# Patient Record
Sex: Male | Born: 1960
Health system: Southern US, Community
[De-identification: ages and names within clinical notes are randomized; demographics above are authoritative.]

## PROBLEM LIST (undated history)

## (undated) DIAGNOSIS — K648 Other hemorrhoids: Secondary | ICD-10-CM

## (undated) DIAGNOSIS — K589 Irritable bowel syndrome without diarrhea: Secondary | ICD-10-CM

## (undated) DIAGNOSIS — Z87442 Personal history of urinary calculi: Secondary | ICD-10-CM

## (undated) DIAGNOSIS — K264 Chronic or unspecified duodenal ulcer with hemorrhage: Secondary | ICD-10-CM

## (undated) DIAGNOSIS — T7840XA Allergy, unspecified, initial encounter: Secondary | ICD-10-CM

## (undated) DIAGNOSIS — I10 Essential (primary) hypertension: Secondary | ICD-10-CM

## (undated) DIAGNOSIS — N200 Calculus of kidney: Secondary | ICD-10-CM

## (undated) DIAGNOSIS — M1990 Unspecified osteoarthritis, unspecified site: Secondary | ICD-10-CM

## (undated) DIAGNOSIS — F338 Other recurrent depressive disorders: Secondary | ICD-10-CM

## (undated) DIAGNOSIS — E559 Vitamin D deficiency, unspecified: Secondary | ICD-10-CM

## (undated) HISTORY — DX: Other recurrent depressive disorders: F33.8

## (undated) HISTORY — DX: Calculus of kidney: N20.0

## (undated) HISTORY — DX: Allergy, unspecified, initial encounter: T78.40XA

## (undated) HISTORY — DX: Gilbert syndrome: E80.4

## (undated) HISTORY — DX: Chronic or unspecified duodenal ulcer with hemorrhage: K26.4

## (undated) HISTORY — DX: Unspecified osteoarthritis, unspecified site: M19.90

## (undated) HISTORY — DX: Vitamin D deficiency, unspecified: E55.9

## (undated) HISTORY — DX: Irritable bowel syndrome, unspecified: K58.9

## (undated) HISTORY — DX: Other hemorrhoids: K64.8

---
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
## 1990-08-07 HISTORY — PX: ESOPHAGOGASTRODUODENOSCOPY: SHX1529

## 1996-08-07 HISTORY — PX: COLONOSCOPY: SHX174

## 2002-10-13 ENCOUNTER — Ambulatory Visit (HOSPITAL_COMMUNITY): Admission: RE | Admit: 2002-10-13 | Discharge: 2002-10-13 | Payer: Self-pay | Admitting: Internal Medicine

## 2002-10-13 ENCOUNTER — Encounter: Payer: Self-pay | Admitting: Internal Medicine

## 2002-12-28 ENCOUNTER — Ambulatory Visit (HOSPITAL_COMMUNITY): Admission: RE | Admit: 2002-12-28 | Discharge: 2002-12-28 | Payer: Self-pay | Admitting: Orthopedic Surgery

## 2002-12-28 ENCOUNTER — Encounter: Payer: Self-pay | Admitting: Orthopedic Surgery

## 2004-04-27 ENCOUNTER — Ambulatory Visit (HOSPITAL_COMMUNITY): Admission: RE | Admit: 2004-04-27 | Discharge: 2004-04-27 | Payer: Self-pay | Admitting: Internal Medicine

## 2005-03-01 ENCOUNTER — Ambulatory Visit: Payer: Self-pay | Admitting: Gastroenterology

## 2006-03-08 ENCOUNTER — Ambulatory Visit: Payer: Self-pay | Admitting: Internal Medicine

## 2006-03-14 ENCOUNTER — Ambulatory Visit: Payer: Self-pay | Admitting: Internal Medicine

## 2007-03-29 ENCOUNTER — Ambulatory Visit: Payer: Self-pay | Admitting: Internal Medicine

## 2007-04-10 ENCOUNTER — Encounter: Admission: RE | Admit: 2007-04-10 | Discharge: 2007-04-10 | Payer: Self-pay | Admitting: Internal Medicine

## 2007-05-06 ENCOUNTER — Encounter: Payer: Self-pay | Admitting: Internal Medicine

## 2007-05-06 DIAGNOSIS — J309 Allergic rhinitis, unspecified: Secondary | ICD-10-CM | POA: Insufficient documentation

## 2007-05-06 DIAGNOSIS — D509 Iron deficiency anemia, unspecified: Secondary | ICD-10-CM | POA: Insufficient documentation

## 2007-05-29 ENCOUNTER — Ambulatory Visit: Payer: Self-pay | Admitting: Internal Medicine

## 2007-05-29 LAB — CONVERTED CEMR LAB
ALT: 21 units/L (ref 0–53)
AST: 18 units/L (ref 0–37)
Albumin: 4.6 g/dL (ref 3.5–5.2)
Alkaline Phosphatase: 67 units/L (ref 39–117)
BUN: 20 mg/dL (ref 6–23)
Basophils Absolute: 0 10*3/uL (ref 0.0–0.1)
Basophils Relative: 0.2 % (ref 0.0–1.0)
Bilirubin Urine: NEGATIVE
Bilirubin, Direct: 0.3 mg/dL (ref 0.0–0.3)
CO2: 31 meq/L (ref 19–32)
Calcium: 10 mg/dL (ref 8.4–10.5)
Chloride: 107 meq/L (ref 96–112)
Cholesterol: 197 mg/dL (ref 0–200)
Creatinine, Ser: 1 mg/dL (ref 0.4–1.5)
Eosinophils Absolute: 0.1 10*3/uL (ref 0.0–0.6)
Eosinophils Relative: 2 % (ref 0.0–5.0)
GFR calc Af Amer: 104 mL/min
GFR calc non Af Amer: 86 mL/min
Glucose, Bld: 111 mg/dL — ABNORMAL HIGH (ref 70–99)
HCT: 46.7 % (ref 39.0–52.0)
HDL: 58.4 mg/dL (ref >39.0)
Hemoglobin: 16.1 g/dL (ref 13.0–17.0)
Ketones, ur: NEGATIVE mg/dL
LDL Cholesterol: 124 mg/dL — ABNORMAL HIGH (ref 0–99)
Leukocytes, UA: NEGATIVE
Lymphocytes Relative: 29.2 % (ref 12.0–46.0)
MCHC: 34.4 g/dL (ref 30.0–36.0)
MCV: 89.8 fL (ref 78.0–100.0)
Monocytes Absolute: 0.4 10*3/uL (ref 0.2–0.7)
Monocytes Relative: 6.2 % (ref 3.0–11.0)
Neutro Abs: 4 10*3/uL (ref 1.4–7.7)
Neutrophils Relative %: 62.4 % (ref 43.0–77.0)
Nitrite: NEGATIVE
PSA: 0.44 ng/mL (ref 0.10–4.00)
Platelets: 224 10*3/uL (ref 150–400)
Potassium: 4.5 meq/L (ref 3.5–5.1)
RBC: 5.2 M/uL (ref 4.22–5.81)
RDW: 12.2 % (ref 11.5–14.6)
Sodium: 141 meq/L (ref 135–145)
Specific Gravity, Urine: 1.015 (ref 1.000–1.03)
TSH: 1.37 microintl units/mL (ref 0.35–5.50)
Total Bilirubin: 1.9 mg/dL — ABNORMAL HIGH (ref 0.3–1.2)
Total CHOL/HDL Ratio: 3.4
Total Protein, Urine: NEGATIVE mg/dL
Total Protein: 7.5 g/dL (ref 6.0–8.3)
Triglycerides: 73 mg/dL (ref 0–149)
Urine Glucose: NEGATIVE mg/dL
Urobilinogen, UA: 0.2 (ref 0.0–1.0)
VLDL: 15 mg/dL (ref 0–40)
Vit D, 1,25-Dihydroxy: 14 — ABNORMAL LOW (ref 30–89)
WBC: 6.3 10*3/uL (ref 4.5–10.5)
pH: 7 (ref 5.0–8.0)

## 2007-06-05 ENCOUNTER — Telehealth: Payer: Self-pay | Admitting: Internal Medicine

## 2007-06-21 ENCOUNTER — Encounter: Payer: Self-pay | Admitting: Internal Medicine

## 2007-06-21 ENCOUNTER — Ambulatory Visit: Payer: Self-pay | Admitting: Cardiology

## 2007-06-21 DIAGNOSIS — N200 Calculus of kidney: Secondary | ICD-10-CM | POA: Insufficient documentation

## 2008-11-04 ENCOUNTER — Encounter: Payer: Self-pay | Admitting: Internal Medicine

## 2010-09-06 NOTE — Progress Notes (Signed)
Summary: Rx request  Phone Note Call from Patient   Summary of Call: Passing a kidney stone. Refill meds Initial call taken by: Tresa Garter MD,  June 05, 2007 5:31 PM  Follow-up for Phone Call        Maralyn Sago, Pls, send it to Dr G.'s nurse in [facility] Follow-up by: Tresa Garter MD,  June 05, 2007 5:34 PM  Additional Follow-up for Phone Call Additional follow up Details #1::        Hand delivered to Dr Timoteo Ex[facility] nurse on 3rd floor Additional Follow-up by: Lamar Sprinkles,  June 06, 2007 8:57 AM    New/Updated Medications: KETOROLAC TROMETHAMINE 10 MG  TABS (KETOROLAC TROMETHAMINE) 1 three times a day as needed pain VICODIN 5-500 MG TABS (HYDROCODONE-ACETAMINOPHEN) 1 qid prn   Prescriptions: VICODIN 5-500 MG TABS (HYDROCODONE-ACETAMINOPHEN) 1 qid prn  #60 x 0   Entered and Authorized by:   Tresa Garter MD   Signed by:   Tresa Garter MD on 06/05/2007   Method used:   Print then Give to Patient   RxID:   [id number] KETOROLAC TROMETHAMINE 10 MG  TABS (KETOROLAC TROMETHAMINE) 1 three times a day as needed pain  #30 x 1   Entered and Authorized by:   Tresa Garter MD   Signed by:   Tresa Garter MD on 06/05/2007   Method used:   Print then Give to Patient   RxID:   [id number]

## 2010-09-06 NOTE — Miscellaneous (Signed)
Summary: CT  Clinical Lists Changes  Orders: Added new Referral order of Radiology Referral (Radiology) - Signed

## 2010-09-06 NOTE — Miscellaneous (Signed)
Summary: Orders Update  Clinical Lists Changes  Problems: Added new problem of KIDNEY STONE (ICD-592.0) Orders: Added new Test order of CT without Contrast (CT w/o contrast) - Signed

## 2010-09-06 NOTE — Miscellaneous (Signed)
Summary: rx  Clinical Lists Changes  Medications: Changed medication from FLONASE 50 MCG/ACT  SUSP (FLUTICASONE PROPIONATE) once daily to FLONASE 50 MCG/ACT  SUSP (FLUTICASONE PROPIONATE) 1 spr each nostr once daily - Signed Rx of FLONASE 50 MCG/ACT  SUSP (FLUTICASONE PROPIONATE) 1 spr each nostr once daily;  #3 x 3;  Signed;  Entered by: Tresa Garter MD;  Authorized by: Tresa Garter MD;  Method used: Print then Give to Patient Rx of FLONASE 50 MCG/ACT  SUSP (FLUTICASONE PROPIONATE) 1 spr each nostr once daily;  #3 x 3;  Signed;  Entered by: Lamar Sprinkles;  Authorized by: Tresa Garter MD;  Method used: Faxed to [facility]*, , ,   , Ph: [phone], Fax: [phone]    Prescriptions: FLONASE 50 MCG/ACT  SUSP (FLUTICASONE PROPIONATE) 1 spr each nostr once daily  #3 x 3   Entered by:   Lamar Sprinkles   Authorized by:   Tresa Garter MD   Signed by:   Lamar Sprinkles on 11/04/2008   Method used:   Faxed to ...       [facility]* (mail-order)             ,          Ph: [phone]       Fax: [phone]   RxID:   [id number] FLONASE 50 MCG/ACT  SUSP (FLUTICASONE PROPIONATE) 1 spr each nostr once daily  #3 x 3   Entered and Authorized by:   Tresa Garter MD   Signed by:   Tresa Garter MD on 11/04/2008   Method used:   Print then Give to Patient   RxID:   [id number]

## 2010-12-20 NOTE — Assessment & Plan Note (Signed)
[facility]                           PRIMARY CARE OFFICE NOTE   NAME:[name], [name]                       MRN:          [id number]  DATE:03/29/2007                            DOB:          1960/12/29    The patient is a 50 year old male who presents with a complaint of:  1. Pain in the neck, mostly on the left side.  The pain is mostly      located in the left trapezius shoulder, occasionally around the      elbow area near the lateral epicondyle.  No clumsiness.  Very      little paresthesia.  The pain is worse during colonoscopies or      other light activities.  Has been going on for several months if      not years.  Getting progressively worse.  2. He is also complaining of allergies.  3. Complains of itchy eyes.  4. Seasonal mood disorder.   PHYSICAL:  Blood pressure 132/79, pulse 74, temperature 98.2, weight 183  pounds.  He is in no acute distress.  Right-handed.  He is alert, oriented, and  cooperative.  No depression present.  HEENT:  Normal mucosa.  NECK:  Supple.  Slightly sensitive over the left trapezius.  Shoulders with full range  of motion.  Muscles without deficit.  Peripheral strength is within  normal limits.  Spine movements normal.  LUNGS:  Clear.  HEART:  Regular.  ABDOMEN:  Soft and nontender.  LOWER EXTREMITIES:  Without edema.   ASSESSMENT AND PLAN:  1. Left-sided cervical/left upper extremity pain.  Will obtain MRI of      the C spine to rule out radiculopathy producing abnormalities.      Could be related to his occupation and a heavy procedure load.      There is some evidence of possible lateral epicondylitis over the      left elbow.  Possibly some tendinitis as well.  I gave him voltaren      cream to use.  He has been using NSAID with PPI p.r.n.  He will see      if the endoscopy room ergonomics could be changed some.  2. Seasonal allergies.  Loratadine 10 mg daily.  Optivar drops 1 drop      each eye daily.   Twice daily Flonase, 1 or 2 sprays each nostril      daily.  3. Seasonal mood disorder.  He would like to try Effexor.  Will start      on 37.5 mg for a month then 75 mg.     Donald Quint. Plotnikov, MD  Electronically Signed    AVP/MedQ  DD: 04/01/2007  DT: 04/02/2007  Job #: [id number]

## 2010-12-23 NOTE — Assessment & Plan Note (Signed)
[facility]                             PRIMARY CARE OFFICE NOTE   NAME:[name], [name]                       MRN:          [id number]  DATE:03/14/2006                            DOB:          [birth year]    The patient is a 50 year old male who presents for a well examination.  Past  medical history, family history and social history are as per March 10, 2003.   ALLERGIES:  None.   CURRENT MEDICATIONS:  He takes Prevacid at times, allergy over-the-counter  medications at times.  Was on Zoloft 100 mg daily November through March for  seasonal depression per Dr. Donell Beers.   REVIEW OF SYSTEMS:  Currently doing well emotionally.  No chest pain or  shortness of breath.  Occasional problems with hemorrhoids.  No blood in the  stool.  The rest is unremarkable.   PHYSICAL EXAMINATION:  GENERAL:  He looks well.  VITAL SIGNS:  Blood pressure 116/69, pulse 71, temperature 99.8, weight 175  pounds.  HEENT:  Moist mucosa.  NECK:  Supple, no thyromegaly or bruit.  LUNGS:  Clear, no wheeze or rales.  CARDIAC:  S1, S2, no gallop, no murmur.  ABDOMEN:  Soft, nontender, no organomegaly or mass felt.  EXTREMITIES:  Lower extremities without edema.  NEUROLOGIC:  He is alert, oriented and co-operative.  He is not depressed.  Normal genitalia, testicles free of masses.  RECTAL:  Normal prostate.  No masses.  No nodules.  Stool guaiac-negative.  SKIN:  Fair skin with multiple freckles.  Several small moles on the back  and lower extremities.   LABORATORY DATA:  EKG was normal sinus rhythm.  Labs on March 09, 2006:  CBC  normal.  Cholesterol 182, LDL 123, glucose 104.  TSH normal.  Urinalysis  normal.  EKG today is normal.   ASSESSMENT AND PLAN:  1. Age/health related issues discussed.  Healthy lifestyle discussed.  He      will continue with good diet and exercise program.  He can start to      take a baby aspirin a day, calcium and vitamin D, fish oil.  2.  Slightly elevated LDL, which is near normal.  Again, he will continue      with fish oil and exercise.  3. slightly elevated glucose, of no significance.  We will recheck in 12      months.  4. Moles.  He does regular skin self-checkup and also has it checked by      his dermatologist      friend.  Continue with the surveillance.  Biopsy any mole changes in      color.  5. Season depression, managed by Dr. Donell Beers, doing well now.                                   [name], MD   AP/MedQ  DD:  03/15/2006  DT:  03/15/2006  Job #:  [id number]

## 2013-06-27 ENCOUNTER — Other Ambulatory Visit: Payer: Self-pay

## 2013-06-27 DIAGNOSIS — Z1211 Encounter for screening for malignant neoplasm of colon: Secondary | ICD-10-CM

## 2013-06-27 MED ORDER — MOVIPREP 100 G PO SOLR: 1.0000 | Freq: Once | ORAL | Status: DC

## 2013-07-01 ENCOUNTER — Encounter: Payer: Self-pay | Admitting: Gastroenterology

## 2013-07-02 ENCOUNTER — Ambulatory Visit (AMBULATORY_SURGERY_CENTER): Payer: 59 | Admitting: Gastroenterology

## 2013-07-02 ENCOUNTER — Encounter: Payer: Self-pay | Admitting: Gastroenterology

## 2013-07-02 VITALS — BP 157/94 | HR 63 | Temp 96.8°F | Resp 18 | Ht 72.0 in | Wt 176.0 lb

## 2013-07-02 DIAGNOSIS — Z1211 Encounter for screening for malignant neoplasm of colon: Secondary | ICD-10-CM

## 2013-07-02 DIAGNOSIS — D126 Benign neoplasm of colon, unspecified: Secondary | ICD-10-CM

## 2013-07-02 MED ORDER — SODIUM CHLORIDE 0.9 % IV SOLN: 500.0000 mL | INTRAVENOUS | Status: DC

## 2013-07-02 NOTE — Patient Instructions (Signed)
Polyp Instruction Given.  YOU HAD AN ENDOSCOPIC PROCEDURE TODAY AT THE [facility]: Refer to the procedure report that was given to you for any specific questions about what was found during the examination.  If the procedure report does not answer your questions, please call your gastroenterologist to clarify.  If you requested that your care partner not be given the details of your procedure findings, then the procedure report has been included in a sealed envelope for you to review at your convenience later.  YOU SHOULD EXPECT: Some feelings of bloating in the abdomen. Passage of more gas than usual.  Walking can help get rid of the air that was put into your GI tract during the procedure and reduce the bloating. If you had a lower endoscopy (such as a colonoscopy or flexible sigmoidoscopy) you may notice spotting of blood in your stool or on the toilet paper. If you underwent a bowel prep for your procedure, then you may not have a normal bowel movement for a few days.  DIET: Your first meal following the procedure should be a light meal and then it is ok to progress to your normal diet.  A half-sandwich or bowl of soup is an example of a good first meal.  Heavy or fried foods are harder to digest and may make you feel nauseous or bloated.  Likewise meals heavy in dairy and vegetables can cause extra gas to form and this can also increase the bloating.  Drink plenty of fluids but you should avoid alcoholic beverages for 24 hours.  ACTIVITY: Your care partner should take you home directly after the procedure.  You should plan to take it easy, moving slowly for the rest of the day.  You can resume normal activity the day after the procedure however you should NOT DRIVE or use heavy machinery for 24 hours (because of the sedation medicines used during the test).    SYMPTOMS TO REPORT IMMEDIATELY: A gastroenterologist can be reached at any hour.  During normal business hours, 8:30 AM to 5:00  PM Monday through Friday, call [phone].  After hours and on weekends, please call the GI answering service at [phone] who will take a message and have the physician on call contact you.   Following lower endoscopy (colonoscopy or flexible sigmoidoscopy):  Excessive amounts of blood in the stool  Significant tenderness or worsening of abdominal pains  Swelling of the abdomen that is new, acute  Fever of 100F or higher   FOLLOW UP: If any biopsies were taken you will be contacted by phone or by letter within the next 1-3 weeks.  Call your gastroenterologist if you have not heard about the biopsies in 3 weeks.  Our staff will call the home number listed on your records the next business day following your procedure to check on you and address any questions or concerns that you may have at that time regarding the information given to you following your procedure. This is a courtesy call and so if there is no answer at the home number and we have not heard from you through the emergency physician on call, we will assume that you have returned to your regular daily activities without incident.  SIGNATURES/CONFIDENTIALITY: You and/or your care partner have signed paperwork which will be entered into your electronic medical record.  These signatures attest to the fact that that the information above on your After Visit Summary has been reviewed and is understood.  Full responsibility  of the confidentiality of this discharge information lies with you and/or your care-partner.

## 2013-07-02 NOTE — Progress Notes (Signed)
Lidocaine-40mg IV prior to Propofol InductionPropofol given over incremental dosages 

## 2013-07-02 NOTE — Progress Notes (Signed)
Patient did not have preoperative order for IV antibiotic SSI prophylaxis. (G8918)  Patient did not experience any of the following events: a burn prior to discharge; a fall within the facility; wrong site/side/patient/procedure/implant event; or a hospital transfer or hospital admission upon discharge from the facility. (G8907)  

## 2013-07-02 NOTE — Progress Notes (Signed)
No egg or soy allergy. ewm No problems with past sedation. ewm 

## 2013-07-02 NOTE — Op Note (Signed)
[facility] [address] [place], [address]   COLONOSCOPY PROCEDURE REPORT  PATIENT: [name], [name]  MR#: [id number] BIRTHDATE: 1960/09/15 , 52  yrs. old GENDER: Male ENDOSCOPIST: Rachael Fee, MD PROCEDURE DATE:  07/02/2013 PROCEDURE:   Colonoscopy with biopsy First Screening Colonoscopy - Avg.  risk and is 50 yrs.  old or older Yes.  Prior Negative Screening - Now for repeat screening. N/A  History of Adenoma - Now for follow-up colonoscopy & has been > or = to 3 yrs.  N/A  Polyps Removed Today? Yes. ASA CLASS:   Class I INDICATIONS:average risk screening. MEDICATIONS: Propofol (Diprivan) 230 mg IV and MAC sedation, administered by CRNA  DESCRIPTION OF PROCEDURE:   After the risks benefits and alternatives of the procedure were thoroughly explained, informed consent was obtained.  A digital rectal exam revealed no abnormalities of the rectum.   The [facility] [id number]  endoscope was introduced through the anus and advanced to the cecum, which was identified by both the appendix and ileocecal valve. No adverse events experienced.   The quality of the prep was excellent.  The instrument was then slowly withdrawn as the colon was fully examined.   COLON FINDINGS: One polyp was found, removed and sent to pathology. This was sessile 1-35mm across, located in sigmoid segment, removed with biopsy forceps.  The examination was otherwise normal. Retroflexed views revealed no abnormalities. The time to cecum=3 minutes 22 seconds.  Withdrawal time=11 minutes 30 seconds.  The scope was withdrawn and the procedure completed. COMPLICATIONS: There were no complications.  ENDOSCOPIC IMPRESSION: One polyp was found, removed and sent to pathology. The examination was otherwise normal.  RECOMMENDATIONS: If the polyp removed today is proven to be an adenomatous (pre-cancerous) polyp, you will need a repeat colonoscopy in 5 years.  Otherwise you should continue to  follow colorectal cancer screening guidelines for "routine risk" patients with colonoscopy in 10 years.  You will receive a letter within 1-2 weeks with the results of your biopsy as well as final recommendations.  Please call my office if you have not received a letter after 3 weeks.   eSigned:  Rachael Fee, MD 07/02/2013 8:46 AM   cc: Eric Form, MD   PATIENT NAME:  [name], [name] MR#: [id number]

## 2013-07-02 NOTE — Progress Notes (Signed)
Called to room to assist during endoscopic procedure.  Patient ID and intended procedure confirmed with present staff. Received instructions for my participation in the procedure from the performing physician. ewm 

## 2013-07-07 ENCOUNTER — Telehealth: Payer: Self-pay | Admitting: *Deleted

## 2013-07-07 NOTE — Telephone Encounter (Signed)
  Follow up Call-  Call back number 07/02/2013  Post procedure Call Back phone  # [phone]  Permission to leave phone message Yes     Patient questions:  Do you have a fever, pain , or abdominal swelling? no Pain Score  0 *  Have you tolerated food without any problems? yes  Have you been able to return to your normal activities? yes  Do you have any questions about your discharge instructions: Diet   no Medications  no Follow up visit  no  Do you have questions or concerns about your Care? no  Actions: * If pain score is 4 or above: No action needed, pain <4.

## 2013-07-08 ENCOUNTER — Encounter: Payer: Self-pay | Admitting: Gastroenterology

## 2015-09-16 DIAGNOSIS — D2272 Melanocytic nevi of left lower limb, including hip: Secondary | ICD-10-CM | POA: Diagnosis not present

## 2015-09-16 DIAGNOSIS — D171 Benign lipomatous neoplasm of skin and subcutaneous tissue of trunk: Secondary | ICD-10-CM | POA: Diagnosis not present

## 2015-09-16 DIAGNOSIS — D2271 Melanocytic nevi of right lower limb, including hip: Secondary | ICD-10-CM | POA: Diagnosis not present

## 2015-09-16 DIAGNOSIS — D485 Neoplasm of uncertain behavior of skin: Secondary | ICD-10-CM | POA: Diagnosis not present

## 2015-09-16 DIAGNOSIS — D2261 Melanocytic nevi of right upper limb, including shoulder: Secondary | ICD-10-CM | POA: Diagnosis not present

## 2015-09-16 DIAGNOSIS — D1801 Hemangioma of skin and subcutaneous tissue: Secondary | ICD-10-CM | POA: Diagnosis not present

## 2015-09-16 DIAGNOSIS — L821 Other seborrheic keratosis: Secondary | ICD-10-CM | POA: Diagnosis not present

## 2015-09-16 DIAGNOSIS — D225 Melanocytic nevi of trunk: Secondary | ICD-10-CM | POA: Diagnosis not present

## 2015-09-16 DIAGNOSIS — D2262 Melanocytic nevi of left upper limb, including shoulder: Secondary | ICD-10-CM | POA: Diagnosis not present

## 2015-09-22 DIAGNOSIS — H5213 Myopia, bilateral: Secondary | ICD-10-CM | POA: Diagnosis not present

## 2015-09-23 MED FILL — ATORVASTATIN 20 MG TABLET: 20 | 90 days supply | Qty: 90 | Fill #0

## 2015-09-27 DIAGNOSIS — D2261 Melanocytic nevi of right upper limb, including shoulder: Secondary | ICD-10-CM | POA: Diagnosis not present

## 2015-09-27 DIAGNOSIS — D485 Neoplasm of uncertain behavior of skin: Secondary | ICD-10-CM | POA: Diagnosis not present

## 2015-09-29 MED FILL — MUPIROCIN 2% OINTMENT: 2 | 7 days supply | Qty: 22 | Fill #0

## 2015-11-02 MED FILL — DULoxetine HCL 60 MG CPEP: 60 | 90 days supply | Qty: 90 | Fill #1

## 2016-01-10 MED FILL — ATORVASTATIN 20 MG TABLET: 20 | 90 days supply | Qty: 90 | Fill #1

## 2016-01-19 MED FILL — CEPHALEXIN 500 MG CAPSULE: 500 | 7 days supply | Qty: 30 | Fill #0

## 2016-01-19 MED FILL — predniSONE 10 MG TABS: 10 | 12 days supply | Qty: 42 | Fill #0

## 2016-01-19 MED FILL — METHOCARBAMOL 500 MG TABLET: 500 | 22 days supply | Qty: 90 | Fill #1

## 2016-01-19 MED FILL — DULoxetine HCL 60 MG CPEP: 60 | 90 days supply | Qty: 90 | Fill #0

## 2016-01-21 ENCOUNTER — Telehealth: Payer: Self-pay | Admitting: Gastroenterology

## 2016-01-21 MED ORDER — ACETAZOLAMIDE 125 MG PO TABS: 125.0000 mg | ORAL_TABLET | Freq: Two times a day (BID) | ORAL | Status: DC | PRN

## 2016-01-21 MED ORDER — DEXAMETHASONE 4 MG PO TABS: 4.0000 mg | ORAL_TABLET | Freq: Four times a day (QID) | ORAL | Status: DC | PRN

## 2016-01-21 MED FILL — DEXAMETHASONE 4 MG TABLET: 4 | 7 days supply | Qty: 30 | Fill #0

## 2016-01-21 MED FILL — acetaZOLAMIDE 125 MG TABS: 125 | 10 days supply | Qty: 20 | Fill #0

## 2016-01-21 NOTE — Telephone Encounter (Signed)
[name] is going on an extended hiking trip, some at high altitude.  I'm calling in meds to help with altitude sickness, PRN.   Dexamethasone and aceazolamide

## 2016-05-15 MED FILL — DULoxetine HCL 60 MG CPEP: 60 | 90 days supply | Qty: 90 | Fill #1

## 2016-06-23 MED FILL — ATORVASTATIN 20 MG TABLET: 20 | 90 days supply | Qty: 90 | Fill #2

## 2016-06-23 MED FILL — VIVOTIF EC CAPSULE: 8 days supply | Qty: 4 | Fill #0

## 2016-07-11 DIAGNOSIS — R7301 Impaired fasting glucose: Secondary | ICD-10-CM | POA: Diagnosis not present

## 2016-07-11 DIAGNOSIS — E559 Vitamin D deficiency, unspecified: Secondary | ICD-10-CM | POA: Diagnosis not present

## 2016-07-11 DIAGNOSIS — Z125 Encounter for screening for malignant neoplasm of prostate: Secondary | ICD-10-CM | POA: Diagnosis not present

## 2016-07-11 DIAGNOSIS — E784 Other hyperlipidemia: Secondary | ICD-10-CM | POA: Diagnosis not present

## 2016-07-21 DIAGNOSIS — Z6827 Body mass index (BMI) 27.0-27.9, adult: Secondary | ICD-10-CM | POA: Diagnosis not present

## 2016-07-21 DIAGNOSIS — Z125 Encounter for screening for malignant neoplasm of prostate: Secondary | ICD-10-CM | POA: Diagnosis not present

## 2016-07-21 DIAGNOSIS — Z0000 Encounter for general adult medical examination without abnormal findings: Secondary | ICD-10-CM | POA: Diagnosis not present

## 2016-07-21 DIAGNOSIS — R7301 Impaired fasting glucose: Secondary | ICD-10-CM | POA: Diagnosis not present

## 2016-07-21 DIAGNOSIS — E784 Other hyperlipidemia: Secondary | ICD-10-CM | POA: Diagnosis not present

## 2016-07-21 DIAGNOSIS — F338 Other recurrent depressive disorders: Secondary | ICD-10-CM | POA: Diagnosis not present

## 2016-07-21 DIAGNOSIS — R030 Elevated blood-pressure reading, without diagnosis of hypertension: Secondary | ICD-10-CM | POA: Diagnosis not present

## 2016-07-21 DIAGNOSIS — K648 Other hemorrhoids: Secondary | ICD-10-CM | POA: Diagnosis not present

## 2016-07-21 DIAGNOSIS — Z86010 Personal history of colonic polyps: Secondary | ICD-10-CM | POA: Diagnosis not present

## 2016-07-21 DIAGNOSIS — E559 Vitamin D deficiency, unspecified: Secondary | ICD-10-CM | POA: Diagnosis not present

## 2016-07-21 DIAGNOSIS — Z1389 Encounter for screening for other disorder: Secondary | ICD-10-CM | POA: Diagnosis not present

## 2016-08-16 MED FILL — DULoxetine HCL 60 MG CPEP: 60 | 90 days supply | Qty: 90 | Fill #0

## 2016-09-19 DIAGNOSIS — D1801 Hemangioma of skin and subcutaneous tissue: Secondary | ICD-10-CM | POA: Diagnosis not present

## 2016-09-19 DIAGNOSIS — D2272 Melanocytic nevi of left lower limb, including hip: Secondary | ICD-10-CM | POA: Diagnosis not present

## 2016-09-19 DIAGNOSIS — D2271 Melanocytic nevi of right lower limb, including hip: Secondary | ICD-10-CM | POA: Diagnosis not present

## 2016-09-19 DIAGNOSIS — D225 Melanocytic nevi of trunk: Secondary | ICD-10-CM | POA: Diagnosis not present

## 2016-09-19 DIAGNOSIS — D2262 Melanocytic nevi of left upper limb, including shoulder: Secondary | ICD-10-CM | POA: Diagnosis not present

## 2016-09-19 DIAGNOSIS — D2261 Melanocytic nevi of right upper limb, including shoulder: Secondary | ICD-10-CM | POA: Diagnosis not present

## 2016-09-19 DIAGNOSIS — D171 Benign lipomatous neoplasm of skin and subcutaneous tissue of trunk: Secondary | ICD-10-CM | POA: Diagnosis not present

## 2016-11-09 MED FILL — ATORVASTATIN 20 MG TABLET: 20 | 90 days supply | Qty: 90 | Fill #0

## 2016-11-14 MED FILL — DULoxetine HCL 60 MG CPEP: 60 | 90 days supply | Qty: 90 | Fill #1

## 2017-03-06 MED FILL — DULoxetine HCL 60 MG CPEP: 60 | 90 days supply | Qty: 90 | Fill #2

## 2017-03-06 MED FILL — ATORVASTATIN 20 MG TABLET: 20 | 90 days supply | Qty: 90 | Fill #1

## 2017-06-05 MED FILL — DULoxetine HCL 60 MG CPEP: 60 | 90 days supply | Qty: 90 | Fill #0

## 2017-06-05 MED FILL — ATORVASTATIN 20 MG TABLET: 20 | 90 days supply | Qty: 90 | Fill #2

## 2017-08-08 DIAGNOSIS — R82998 Other abnormal findings in urine: Secondary | ICD-10-CM | POA: Diagnosis not present

## 2017-08-09 DIAGNOSIS — E7849 Other hyperlipidemia: Secondary | ICD-10-CM | POA: Diagnosis not present

## 2017-08-09 DIAGNOSIS — Z125 Encounter for screening for malignant neoplasm of prostate: Secondary | ICD-10-CM | POA: Diagnosis not present

## 2017-08-09 DIAGNOSIS — E559 Vitamin D deficiency, unspecified: Secondary | ICD-10-CM | POA: Diagnosis not present

## 2017-08-09 DIAGNOSIS — R7301 Impaired fasting glucose: Secondary | ICD-10-CM | POA: Diagnosis not present

## 2017-08-14 DIAGNOSIS — J309 Allergic rhinitis, unspecified: Secondary | ICD-10-CM | POA: Diagnosis not present

## 2017-08-14 DIAGNOSIS — E7849 Other hyperlipidemia: Secondary | ICD-10-CM | POA: Diagnosis not present

## 2017-08-14 DIAGNOSIS — Z86010 Personal history of colonic polyps: Secondary | ICD-10-CM | POA: Diagnosis not present

## 2017-08-14 DIAGNOSIS — Z0000 Encounter for general adult medical examination without abnormal findings: Secondary | ICD-10-CM | POA: Diagnosis not present

## 2017-08-14 DIAGNOSIS — F338 Other recurrent depressive disorders: Secondary | ICD-10-CM | POA: Diagnosis not present

## 2017-08-14 DIAGNOSIS — E559 Vitamin D deficiency, unspecified: Secondary | ICD-10-CM | POA: Diagnosis not present

## 2017-08-14 DIAGNOSIS — R030 Elevated blood-pressure reading, without diagnosis of hypertension: Secondary | ICD-10-CM | POA: Diagnosis not present

## 2017-08-14 DIAGNOSIS — M7712 Lateral epicondylitis, left elbow: Secondary | ICD-10-CM | POA: Diagnosis not present

## 2017-08-14 DIAGNOSIS — R7301 Impaired fasting glucose: Secondary | ICD-10-CM | POA: Diagnosis not present

## 2017-08-28 DIAGNOSIS — H16042 Marginal corneal ulcer, left eye: Secondary | ICD-10-CM | POA: Diagnosis not present

## 2017-08-30 DIAGNOSIS — H16041 Marginal corneal ulcer, right eye: Secondary | ICD-10-CM | POA: Diagnosis not present

## 2017-09-03 DIAGNOSIS — H16041 Marginal corneal ulcer, right eye: Secondary | ICD-10-CM | POA: Diagnosis not present

## 2017-09-24 DIAGNOSIS — D225 Melanocytic nevi of trunk: Secondary | ICD-10-CM | POA: Diagnosis not present

## 2017-09-24 DIAGNOSIS — L814 Other melanin hyperpigmentation: Secondary | ICD-10-CM | POA: Diagnosis not present

## 2017-09-24 DIAGNOSIS — D1722 Benign lipomatous neoplasm of skin and subcutaneous tissue of left arm: Secondary | ICD-10-CM | POA: Diagnosis not present

## 2017-09-24 DIAGNOSIS — D171 Benign lipomatous neoplasm of skin and subcutaneous tissue of trunk: Secondary | ICD-10-CM | POA: Diagnosis not present

## 2017-09-24 DIAGNOSIS — D1801 Hemangioma of skin and subcutaneous tissue: Secondary | ICD-10-CM | POA: Diagnosis not present

## 2017-09-24 DIAGNOSIS — D2272 Melanocytic nevi of left lower limb, including hip: Secondary | ICD-10-CM | POA: Diagnosis not present

## 2017-09-24 DIAGNOSIS — D1721 Benign lipomatous neoplasm of skin and subcutaneous tissue of right arm: Secondary | ICD-10-CM | POA: Diagnosis not present

## 2017-09-24 DIAGNOSIS — D485 Neoplasm of uncertain behavior of skin: Secondary | ICD-10-CM | POA: Diagnosis not present

## 2017-09-24 DIAGNOSIS — L820 Inflamed seborrheic keratosis: Secondary | ICD-10-CM | POA: Diagnosis not present

## 2017-09-27 MED FILL — DULoxetine HCL 60 MG CPEP: 60 | 90 days supply | Qty: 90 | Fill #1

## 2017-10-24 DIAGNOSIS — H04221 Epiphora due to insufficient drainage, right lacrimal gland: Secondary | ICD-10-CM | POA: Diagnosis not present

## 2017-10-24 DIAGNOSIS — H10501 Unspecified blepharoconjunctivitis, right eye: Secondary | ICD-10-CM | POA: Diagnosis not present

## 2017-11-21 MED FILL — ATORVASTATIN 20 MG TABLET: 20 | 90 days supply | Qty: 90 | Fill #0

## 2017-12-10 DIAGNOSIS — H524 Presbyopia: Secondary | ICD-10-CM | POA: Diagnosis not present

## 2017-12-10 MED FILL — NEO/POLYMYXIN/DEXAMETH DROP: 3.5-10000-0 | 30 days supply | Qty: 5 | Fill #0

## 2018-01-03 MED FILL — DULoxetine HCL 60 MG CPEP: 60 | 90 days supply | Qty: 90 | Fill #2

## 2018-01-29 DIAGNOSIS — L570 Actinic keratosis: Secondary | ICD-10-CM | POA: Diagnosis not present

## 2018-01-29 DIAGNOSIS — D485 Neoplasm of uncertain behavior of skin: Secondary | ICD-10-CM | POA: Diagnosis not present

## 2018-04-11 MED FILL — DULoxetine HCL 60 MG CPEP: 60 | 90 days supply | Qty: 90 | Fill #0

## 2018-04-11 MED FILL — METHOCARBAMOL 500 MG TABLET: 500 | 22 days supply | Qty: 90 | Fill #0

## 2018-04-11 MED FILL — ATORVASTATIN CALCIUM 20 MG: 20 | 90 days supply | Qty: 90 | Fill #1

## 2018-07-01 DIAGNOSIS — H01002 Unspecified blepharitis right lower eyelid: Secondary | ICD-10-CM | POA: Diagnosis not present

## 2018-07-23 MED FILL — DULOXETINE HCL 60 MG CPEP: 60 | 90 days supply | Qty: 90 | Fill #1

## 2018-07-28 ENCOUNTER — Encounter: Payer: Self-pay | Admitting: Gastroenterology

## 2018-10-09 MED FILL — ATORVASTATIN 20 MG TABLET: 20 | 90 days supply | Qty: 90 | Fill #2

## 2018-10-22 DIAGNOSIS — Z125 Encounter for screening for malignant neoplasm of prostate: Secondary | ICD-10-CM | POA: Diagnosis not present

## 2018-10-22 DIAGNOSIS — R7301 Impaired fasting glucose: Secondary | ICD-10-CM | POA: Diagnosis not present

## 2018-10-22 DIAGNOSIS — E7849 Other hyperlipidemia: Secondary | ICD-10-CM | POA: Diagnosis not present

## 2018-10-22 DIAGNOSIS — E559 Vitamin D deficiency, unspecified: Secondary | ICD-10-CM | POA: Diagnosis not present

## 2018-10-22 DIAGNOSIS — Z0000 Encounter for general adult medical examination without abnormal findings: Secondary | ICD-10-CM | POA: Diagnosis not present

## 2018-10-28 DIAGNOSIS — Z0000 Encounter for general adult medical examination without abnormal findings: Secondary | ICD-10-CM | POA: Diagnosis not present

## 2018-10-28 DIAGNOSIS — Z1331 Encounter for screening for depression: Secondary | ICD-10-CM | POA: Diagnosis not present

## 2018-10-28 DIAGNOSIS — E785 Hyperlipidemia, unspecified: Secondary | ICD-10-CM | POA: Diagnosis not present

## 2018-10-28 DIAGNOSIS — F339 Major depressive disorder, recurrent, unspecified: Secondary | ICD-10-CM | POA: Diagnosis not present

## 2018-10-28 DIAGNOSIS — Z86010 Personal history of colonic polyps: Secondary | ICD-10-CM | POA: Diagnosis not present

## 2018-10-28 DIAGNOSIS — Z1339 Encounter for screening examination for other mental health and behavioral disorders: Secondary | ICD-10-CM | POA: Diagnosis not present

## 2018-10-28 DIAGNOSIS — M542 Cervicalgia: Secondary | ICD-10-CM | POA: Diagnosis not present

## 2018-10-28 DIAGNOSIS — R030 Elevated blood-pressure reading, without diagnosis of hypertension: Secondary | ICD-10-CM | POA: Diagnosis not present

## 2018-10-28 DIAGNOSIS — R7301 Impaired fasting glucose: Secondary | ICD-10-CM | POA: Diagnosis not present

## 2018-10-28 DIAGNOSIS — M25519 Pain in unspecified shoulder: Secondary | ICD-10-CM | POA: Diagnosis not present

## 2018-10-30 MED FILL — DULOXETINE HCL 60 MG CPEP: 60 | 90 days supply | Qty: 90 | Fill #0

## 2018-12-16 DIAGNOSIS — H5213 Myopia, bilateral: Secondary | ICD-10-CM | POA: Diagnosis not present

## 2019-02-11 MED FILL — DULOXETINE HCL 60 MG CPEP: 60 | 90 days supply | Qty: 90 | Fill #1

## 2019-04-11 MED FILL — ATORVASTATIN 20 MG TABLET: 20 | 90 days supply | Qty: 90 | Fill #0

## 2019-05-26 MED FILL — METHOCARBAMOL 500 MG TABS: 500 | 22 days supply | Qty: 90 | Fill #0

## 2019-05-26 MED FILL — DULOXETINE HCL 60 MG CPEP: 60 | 90 days supply | Qty: 90 | Fill #0

## 2019-06-11 DIAGNOSIS — L821 Other seborrheic keratosis: Secondary | ICD-10-CM | POA: Diagnosis not present

## 2019-06-11 DIAGNOSIS — D225 Melanocytic nevi of trunk: Secondary | ICD-10-CM | POA: Diagnosis not present

## 2019-06-11 DIAGNOSIS — D2261 Melanocytic nevi of right upper limb, including shoulder: Secondary | ICD-10-CM | POA: Diagnosis not present

## 2019-06-11 DIAGNOSIS — D171 Benign lipomatous neoplasm of skin and subcutaneous tissue of trunk: Secondary | ICD-10-CM | POA: Diagnosis not present

## 2019-06-11 DIAGNOSIS — D485 Neoplasm of uncertain behavior of skin: Secondary | ICD-10-CM | POA: Diagnosis not present

## 2019-06-11 DIAGNOSIS — D1801 Hemangioma of skin and subcutaneous tissue: Secondary | ICD-10-CM | POA: Diagnosis not present

## 2019-06-11 DIAGNOSIS — D2272 Melanocytic nevi of left lower limb, including hip: Secondary | ICD-10-CM | POA: Diagnosis not present

## 2019-06-11 DIAGNOSIS — L814 Other melanin hyperpigmentation: Secondary | ICD-10-CM | POA: Diagnosis not present

## 2019-06-20 ENCOUNTER — Other Ambulatory Visit: Payer: Self-pay

## 2019-06-20 ENCOUNTER — Ambulatory Visit (INDEPENDENT_AMBULATORY_CARE_PROVIDER_SITE_OTHER)
Admission: RE | Admit: 2019-06-20 | Discharge: 2019-06-20 | Disposition: A | Discharge status: Home / Self Care | Payer: Self-pay | Source: Ambulatory Visit | Attending: Gastroenterology | Admitting: Gastroenterology

## 2019-06-20 ENCOUNTER — Telehealth: Payer: Self-pay | Admitting: Gastroenterology

## 2019-06-20 ENCOUNTER — Other Ambulatory Visit (INDEPENDENT_AMBULATORY_CARE_PROVIDER_SITE_OTHER): Payer: 59

## 2019-06-20 DIAGNOSIS — R319 Hematuria, unspecified: Secondary | ICD-10-CM

## 2019-06-20 DIAGNOSIS — R109 Unspecified abdominal pain: Secondary | ICD-10-CM

## 2019-06-20 LAB — HEPATIC FUNCTION PANEL
ALT: 16 U/L (ref 0–53)
AST: 16 U/L (ref 0–37)
Albumin: 5 g/dL (ref 3.5–5.2)
Alkaline Phosphatase: 68 U/L (ref 39–117)
Bilirubin, Direct: 0.3 mg/dL (ref 0.0–0.3)
Total Bilirubin: 1.6 mg/dL — ABNORMAL HIGH (ref 0.2–1.2)
Total Protein: 7.4 g/dL (ref 6.0–8.3)

## 2019-06-20 LAB — BASIC METABOLIC PANEL
BUN: 20 mg/dL (ref 6–23)
CO2: 28 mEq/L (ref 19–32)
Calcium: 9.3 mg/dL (ref 8.4–10.5)
Chloride: 103 mEq/L (ref 96–112)
Creatinine, Ser: 1.03 mg/dL (ref 0.40–1.50)
GFR: 74.59 mL/min (ref >60.00)
Glucose, Bld: 116 mg/dL — ABNORMAL HIGH (ref 70–99)
Potassium: 4.2 mEq/L (ref 3.5–5.1)
Sodium: 139 mEq/L (ref 135–145)

## 2019-06-20 LAB — URINALYSIS, ROUTINE W REFLEX MICROSCOPIC
Bilirubin Urine: NEGATIVE
Hgb urine dipstick: NEGATIVE
Ketones, ur: NEGATIVE
Leukocytes,Ua: NEGATIVE
Nitrite: NEGATIVE
Specific Gravity, Urine: 1.01 (ref 1.000–1.030)
Total Protein, Urine: NEGATIVE
Urine Glucose: NEGATIVE
Urobilinogen, UA: 0.2 (ref 0.0–1.0)
pH: 6.5 (ref 5.0–8.0)

## 2019-06-20 LAB — CBC
HCT: 45.1 % (ref 39.0–52.0)
Hemoglobin: 15.3 g/dL (ref 13.0–17.0)
MCHC: 34 g/dL (ref 30.0–36.0)
MCV: 90.6 fl (ref 78.0–100.0)
Platelets: 201 10*3/uL (ref 150.0–400.0)
RBC: 4.98 Mil/uL (ref 4.22–5.81)
RDW: 13.6 % (ref 11.5–15.5)
WBC: 7 10*3/uL (ref 4.0–10.5)

## 2019-06-20 NOTE — Addendum Note (Signed)
Addended by: Milus Banister on: 06/20/2019 11:04 AM   Modules accepted: Orders

## 2019-06-20 NOTE — Progress Notes (Signed)
Hepatic function panel added to labs

## 2019-06-20 NOTE — Telephone Encounter (Signed)
Also KUB

## 2019-06-20 NOTE — Telephone Encounter (Signed)
[name] is having flank pain and gross hematuria.  Ordering UA, cbc, bmet now.  Expedite referral to [facility], seems likely kidney stone.

## 2019-06-23 ENCOUNTER — Telehealth: Payer: Self-pay | Admitting: Gastroenterology

## 2019-06-23 DIAGNOSIS — Z7184 Encounter for health counseling related to travel: Secondary | ICD-10-CM

## 2019-06-23 NOTE — Telephone Encounter (Signed)
[name] is traveling out of the country soon.  Covid antibody testing ordered to facilitate travel plans.

## 2019-06-30 ENCOUNTER — Other Ambulatory Visit: Payer: Self-pay | Admitting: Gastroenterology

## 2019-06-30 ENCOUNTER — Other Ambulatory Visit: Payer: Self-pay

## 2019-06-30 DIAGNOSIS — Z7184 Encounter for health counseling related to travel: Secondary | ICD-10-CM | POA: Diagnosis not present

## 2019-07-01 LAB — SAR COV2 SEROLOGY (COVID19)AB(IGG),IA: SARS CoV2 AB IGG: NEGATIVE

## 2019-07-08 DIAGNOSIS — Z1159 Encounter for screening for other viral diseases: Secondary | ICD-10-CM | POA: Diagnosis not present

## 2019-09-02 MED FILL — DULOXETINE HCL 60 MG CPEP: 60 | 90 days supply | Qty: 90 | Fill #1

## 2019-11-18 DIAGNOSIS — Z125 Encounter for screening for malignant neoplasm of prostate: Secondary | ICD-10-CM | POA: Diagnosis not present

## 2019-11-18 DIAGNOSIS — E559 Vitamin D deficiency, unspecified: Secondary | ICD-10-CM | POA: Diagnosis not present

## 2019-11-18 DIAGNOSIS — R7301 Impaired fasting glucose: Secondary | ICD-10-CM | POA: Diagnosis not present

## 2019-11-18 DIAGNOSIS — E7849 Other hyperlipidemia: Secondary | ICD-10-CM | POA: Diagnosis not present

## 2019-11-18 DIAGNOSIS — Z0000 Encounter for general adult medical examination without abnormal findings: Secondary | ICD-10-CM | POA: Diagnosis not present

## 2019-11-21 DIAGNOSIS — E559 Vitamin D deficiency, unspecified: Secondary | ICD-10-CM | POA: Diagnosis not present

## 2019-11-21 DIAGNOSIS — R7301 Impaired fasting glucose: Secondary | ICD-10-CM | POA: Diagnosis not present

## 2019-11-21 DIAGNOSIS — I10 Essential (primary) hypertension: Secondary | ICD-10-CM | POA: Diagnosis not present

## 2019-11-21 DIAGNOSIS — Z0000 Encounter for general adult medical examination without abnormal findings: Secondary | ICD-10-CM | POA: Diagnosis not present

## 2019-11-21 DIAGNOSIS — M25552 Pain in left hip: Secondary | ICD-10-CM | POA: Diagnosis not present

## 2019-11-21 DIAGNOSIS — Z86010 Personal history of colonic polyps: Secondary | ICD-10-CM | POA: Diagnosis not present

## 2019-11-21 DIAGNOSIS — R82998 Other abnormal findings in urine: Secondary | ICD-10-CM | POA: Diagnosis not present

## 2019-11-21 DIAGNOSIS — Z1339 Encounter for screening examination for other mental health and behavioral disorders: Secondary | ICD-10-CM | POA: Diagnosis not present

## 2019-11-21 DIAGNOSIS — J309 Allergic rhinitis, unspecified: Secondary | ICD-10-CM | POA: Diagnosis not present

## 2019-11-21 DIAGNOSIS — Z1331 Encounter for screening for depression: Secondary | ICD-10-CM | POA: Diagnosis not present

## 2019-11-21 DIAGNOSIS — E785 Hyperlipidemia, unspecified: Secondary | ICD-10-CM | POA: Diagnosis not present

## 2019-11-21 DIAGNOSIS — F339 Major depressive disorder, recurrent, unspecified: Secondary | ICD-10-CM | POA: Diagnosis not present

## 2019-11-21 MED FILL — LISINOPRIL 20 MG TABLET: 20 | 30 days supply | Qty: 30 | Fill #0

## 2019-11-28 ENCOUNTER — Ambulatory Visit (INDEPENDENT_AMBULATORY_CARE_PROVIDER_SITE_OTHER): Payer: 59

## 2019-11-28 ENCOUNTER — Ambulatory Visit: Payer: Self-pay

## 2019-11-28 ENCOUNTER — Other Ambulatory Visit: Payer: Self-pay

## 2019-11-28 ENCOUNTER — Ambulatory Visit: Payer: 59 | Admitting: Orthopedic Surgery

## 2019-11-28 ENCOUNTER — Encounter: Payer: Self-pay | Admitting: Orthopedic Surgery

## 2019-11-28 VITALS — Ht 71.0 in | Wt 183.0 lb

## 2019-11-28 DIAGNOSIS — M25552 Pain in left hip: Secondary | ICD-10-CM | POA: Diagnosis not present

## 2019-11-28 DIAGNOSIS — M545 Low back pain, unspecified: Secondary | ICD-10-CM

## 2019-11-28 NOTE — Progress Notes (Signed)
Subjective: Patient is here for ultrasound-guided intra-articular left hip injection.   Impingement pain with cam deformity.  Objective:  Still has good active ROM.  Procedure: Ultrasound-guided left hip injection: After sterile prep with Betadine, injected 8 cc 1% lidocaine without epinephrine and 40 mg methylprednisolone using a 22-gauge spinal needle, passing the needle through the iliofemoral ligament into the femoral head/neck junction.  Injectate seen filling joint capsule.

## 2019-11-28 NOTE — Progress Notes (Signed)
Office Visit Note   Patient: [name]           Date of Birth: 1961-05-24           MRN: [id number] Visit Date: 11/28/2019 Requested by: Marton Redwood, MD [address] [place],  [place] [address] PCP: Marton Redwood, MD  Subjective: Chief Complaint  Patient presents with   Lower Back - Pain   Left Hip - Pain    HPI: [name] is a 59 year old patient with left hip pain and ischial tuberosity pain.'s been going on for 6 months.  It is not improving on its own.  He is active doing hiking and yoga.  Denies any radicular symptoms.  No numbness and tingling.  Hard for him to sleep on the left-hand side.  He did recently change mattresses which helped.  Mountain biking is more prominent in his exercise routine and road biking.  Also does pickleball.  He does report some groin pain when he is doing a hard push uphill on his bike.  Symptoms are typically worse the following day after exertion.  Initial pain does not typically happen at the same time as the groin pain.  Sitting is worse than lying down typically.              ROS: All systems reviewed are negative as they relate to the chief complaint within the history of present illness.  Patient denies  fevers or chills.   Assessment & Plan: Visit Diagnoses:  1. Acute left-sided low back pain, unspecified whether sciatica present   2. Pain in left hip     Plan: Impression is left hip pain with cam impingement likely based on radiographs and exam.  I think he may also have a component of gluteal tendinopathy and ischial hamstring attachment tendinopathy.  I do not think it is coming from the back.  I would like him to get an injection with Dr. Junius Roads today into the hip joint.  We will see how that helps.  Follow-up with me as needed but neck step would be MRI arthrogram of the hip if he is considering any type of surgical intervention.  My sense is that [name] would likely live with this before doing any type of arthroscopic surgery which is  pretty reasonable.  I think an injection every now and then would likely be enough to get him through it.  Follow-Up Instructions: Return if symptoms worsen or fail to improve.   Orders:  Orders Placed This Encounter  Procedures   XR HIP UNILAT W OR W/O PELVIS 2-3 VIEWS LEFT   XR Lumbar Spine 2-3 Views   US Guided Needle Placement   No orders of the defined types were placed in this encounter.     Procedures: No procedures performed   Clinical Data: No additional findings.  Objective: Vital Signs: Ht 5\' 11"  (1.803 m)    Wt 183 lb (83 kg)    BMI 25.52 kg/m   Physical Exam:   Constitutional: Patient appears well-developed HEENT:  Head: Normocephalic Eyes:EOM are normal Neck: Normal range of motion Cardiovascular: Normal rate Pulmonary/chest: Effort normal Neurologic: Patient is alert Skin: Skin is warm Psychiatric: Patient has normal mood and affect    Ortho Exam: Ortho exam demonstrates normal gait alignment.  Very good muscle tone in the legs.  Nonantalgic gait.  No Trendelenburg gait.  No groin pain with internal extra rotation of either leg.  Has 5 out of 5 ankle dorsiflexion plantarflexion quad hamstring strength.  Reflexes symmetric 1+ out of 4 bilateral patella and Achilles.  Negative clonus.  Pedal pulses palpable.  No real trochanteric tenderness is noted.  Mild left gluteal tenderness but no discrete SI joint tenderness to palpation.  Ischial tuberosity nontender left or right hand side.  Specialty Comments:  No specialty comments available.  Imaging: XR HIP UNILAT W OR W/O PELVIS 2-3 VIEWS LEFT  Result Date: 11/28/2019 AP pelvis lateral left hip reviewed.  Cam lesion present consistent with femoral acetabular impingement on the superior femoral neck.  No acute fracture.  No significant degenerative arthritis is present.  Bone density appears good.  XR Lumbar Spine 2-3 Views  Result Date: 11/28/2019 AP lateral lumbar spine reviewed.  Minimal spinal  listhesis present.  No significant degenerative disc disease between the vertebral bodies.  Mild facet arthritis is present.    PMFS History: Patient Active Problem List   Diagnosis Date Noted   KIDNEY STONE 06/21/2007   ANEMIA-IRON DEFICIENCY 05/06/2007   ALLERGIC RHINITIS 05/06/2007   Past Medical History:  Diagnosis Date   Allergy    SEASONAL   Duodenal ulcer with hemorrhage    X2   Gilbert's syndrome    IBS (irritable bowel syndrome)    Internal hemorrhoids    Nephrolithiasis    Osteoarthritis    Seasonal affective disorder ([place])    Vitamin D deficiency     Family History  Problem Relation Age of Onset   Lung cancer Mother 58   Lymphoma Father 42   Cancer - Other Father 61       AMPULLARY   Thyroid cancer Sister 50   Colon cancer Neg Hx     Past Surgical History:  Procedure Laterality Date   COLONOSCOPY  1998   ESOPHAGOGASTRODUODENOSCOPY  1992   TONSILLECTOMY AND ADENOIDECTOMY  1972   Social History   Occupational History   Not on file  Tobacco Use   Smoking status: Never Smoker   Smokeless tobacco: Never Used  Substance and Sexual Activity   Alcohol use: Yes    Alcohol/week: 5.0 standard drinks    Types: 5 drink(s) per week    Comment: 5 drinks a week   Drug use: No   Sexual activity: Not on file

## 2019-12-05 MED FILL — LOSARTAN POTASSIUM 50 MG TA: 50 | 30 days supply | Qty: 30 | Fill #0

## 2019-12-08 ENCOUNTER — Other Ambulatory Visit: Payer: Self-pay

## 2019-12-08 DIAGNOSIS — M25552 Pain in left hip: Secondary | ICD-10-CM

## 2019-12-17 DIAGNOSIS — H5213 Myopia, bilateral: Secondary | ICD-10-CM | POA: Diagnosis not present

## 2019-12-23 ENCOUNTER — Other Ambulatory Visit (HOSPITAL_COMMUNITY): Payer: Self-pay | Admitting: Internal Medicine

## 2019-12-23 MED FILL — OLMESARTAN MEDOXOMIL 40 MG: 40 | 90 days supply | Qty: 90 | Fill #0

## 2019-12-23 MED FILL — MELOXICAM 15 MG TABLET: 15 | 90 days supply | Qty: 90 | Fill #0

## 2019-12-24 ENCOUNTER — Other Ambulatory Visit: Payer: Self-pay

## 2019-12-24 ENCOUNTER — Ambulatory Visit
Admission: RE | Admit: 2019-12-24 | Discharge: 2019-12-24 | Disposition: A | Discharge status: Home / Self Care | Payer: 59 | Source: Ambulatory Visit | Attending: Orthopedic Surgery | Admitting: Orthopedic Surgery

## 2019-12-24 DIAGNOSIS — M25552 Pain in left hip: Secondary | ICD-10-CM | POA: Diagnosis not present

## 2019-12-24 MED ORDER — IOPAMIDOL (ISOVUE-M 200) INJECTION 41%
15.0000 mL | Freq: Once | INTRAMUSCULAR | Status: AC
  Administered 2019-12-24: 15 mL via INTRA_ARTICULAR

## 2019-12-26 ENCOUNTER — Telehealth: Payer: Self-pay

## 2019-12-26 NOTE — Telephone Encounter (Signed)
I had Dr Marlou Sa to reach out to patient to have him contact me regarding this.

## 2019-12-26 NOTE — Telephone Encounter (Signed)
Per Dr Marlou Sa I called Dr Aretha Parrot office and spoke with his assistant Annie Main.  He took all of patients information and will reach out to him about getting appointment scheduled to see Dr Aretha Parrot.  Can you please make patient a CD with him images? I will have him reach out to [facility] about getting his MRI on disc

## 2020-01-14 DIAGNOSIS — M24152 Other articular cartilage disorders, left hip: Secondary | ICD-10-CM | POA: Diagnosis not present

## 2020-01-29 DIAGNOSIS — M25552 Pain in left hip: Secondary | ICD-10-CM | POA: Diagnosis not present

## 2020-01-29 DIAGNOSIS — R269 Unspecified abnormalities of gait and mobility: Secondary | ICD-10-CM | POA: Diagnosis not present

## 2020-02-12 DIAGNOSIS — M25552 Pain in left hip: Secondary | ICD-10-CM | POA: Diagnosis not present

## 2020-02-12 DIAGNOSIS — R269 Unspecified abnormalities of gait and mobility: Secondary | ICD-10-CM | POA: Diagnosis not present

## 2020-02-23 ENCOUNTER — Ambulatory Visit (INDEPENDENT_AMBULATORY_CARE_PROVIDER_SITE_OTHER): Payer: 59

## 2020-02-23 ENCOUNTER — Ambulatory Visit (INDEPENDENT_AMBULATORY_CARE_PROVIDER_SITE_OTHER): Payer: 59 | Admitting: Family Medicine

## 2020-02-23 ENCOUNTER — Encounter: Payer: Self-pay | Admitting: Family Medicine

## 2020-02-23 ENCOUNTER — Other Ambulatory Visit: Payer: Self-pay

## 2020-02-23 ENCOUNTER — Ambulatory Visit: Payer: Self-pay

## 2020-02-23 DIAGNOSIS — G8929 Other chronic pain: Secondary | ICD-10-CM

## 2020-02-23 DIAGNOSIS — M25512 Pain in left shoulder: Secondary | ICD-10-CM

## 2020-02-23 NOTE — Progress Notes (Signed)
Office Visit Note   Patient: [name]           Date of Birth: January 14, 1961           MRN: [id number] Visit Date: 02/23/2020 Requested by: Donald Redwood, MD [address] [place],  [place] [address] PCP: Donald Redwood, MD  Subjective: Chief Complaint  Patient presents with  . Left Shoulder - Pain    HPI: He is here with left shoulder pain.  He is right-hand dominant.  Symptoms started a few months ago, no injury.  He hurts when he raises his arm for prolonged periods, such as when performing a colonoscopy.  It also hurts to lie on his left side.  He is using meloxicam for his hip and it helps his shoulder a little bit.  Denies any neck pain or radicular symptoms.               ROS:   All other systems were reviewed and are negative.  Objective: Vital Signs: There were no vitals taken for this visit.  Physical Exam:  General:  Alert and oriented, in no acute distress. Pulm:  Breathing unlabored. Psy:  Normal mood, congruent affect.  Left shoulder: Full active range of motion compared to the right, no adhesive capsulitis.  Rotator cuff strength is 5/5 throughout with minimal pain on empty can testing.  He is point tender at the [facility] joint and has a positive AC crossover test.  No tenderness in the subacromial space.  Imaging: US Guided Needle Placement  Result Date: 02/23/2020 Ultrasound-guided left AC joint injection:  After sterile prep with betadine, injected 3 cc 1% lidocaine without epinephrine and 40 mg methylprednisolone into the [facility] joint without complication.  Injectate was seen filling the joint capsule.  He had very good immediate relief.  XR Shoulder Left  Result Date: 02/23/2020 X-rays left shoulder reveal anatomic alignment, type I acromion.  Moderate AC joint DJD with joint space narrowing and periarticular spurring.  Glenohumeral joint looks healthy.  Soft tissues look normal.   Assessment & Plan: 1.  Left shoulder pain due to [facility] joint DJD -Discussed options  with him, he would like to try an injection.  He will also try glucosamine.  Voltaren gel will be a future consideration.  Distal clavicle excision if he fails conservative management.  Ultrasound-guided left AC joint injection: After sterile prep with Betadine, injected     Procedures: No procedures performed  No notes on file     PMFS History: Patient Active Problem List   Diagnosis Date Noted  . KIDNEY STONE 06/21/2007  . ANEMIA-IRON DEFICIENCY 05/06/2007  . ALLERGIC RHINITIS 05/06/2007   Past Medical History:  Diagnosis Date  . Allergy    SEASONAL  . Duodenal ulcer with hemorrhage    X2  . Gilbert's syndrome   . IBS (irritable bowel syndrome)   . Internal hemorrhoids   . Nephrolithiasis   . Osteoarthritis   . Seasonal affective disorder ([place])   . Vitamin D deficiency     Family History  Problem Relation Age of Onset  . Lung cancer Mother 32  . Lymphoma Father 25  . Cancer - Other Father 47       AMPULLARY  . Thyroid cancer Sister 34  . Colon cancer Neg Hx     Past Surgical History:  Procedure Laterality Date  . COLONOSCOPY  1998  . ESOPHAGOGASTRODUODENOSCOPY  1992  . [place]   Social History   Occupational  History  . Not on file  Tobacco Use  . Smoking status: Never Smoker  . Smokeless tobacco: Never Used  Substance and Sexual Activity  . Alcohol use: Yes    Alcohol/week: 5.0 standard drinks    Types: 5 drink(s) per week    Comment: 5 drinks a week  . Drug use: No  . Sexual activity: Not on file

## 2020-02-24 DIAGNOSIS — R269 Unspecified abnormalities of gait and mobility: Secondary | ICD-10-CM | POA: Diagnosis not present

## 2020-02-24 DIAGNOSIS — M25552 Pain in left hip: Secondary | ICD-10-CM | POA: Diagnosis not present

## 2020-03-09 DIAGNOSIS — M25552 Pain in left hip: Secondary | ICD-10-CM | POA: Diagnosis not present

## 2020-03-09 DIAGNOSIS — R269 Unspecified abnormalities of gait and mobility: Secondary | ICD-10-CM | POA: Diagnosis not present

## 2020-03-23 MED FILL — OLMESARTAN MEDOXOMIL 40 MG: 40 | 90 days supply | Qty: 90 | Fill #1

## 2020-03-23 MED FILL — DULOXETINE HCL 60 MG CPEP: 60 | 90 days supply | Qty: 90 | Fill #1

## 2020-03-23 MED FILL — MELOXICAM 15 MG TABLET: 15 | 30 days supply | Qty: 30 | Fill #1

## 2020-04-01 DIAGNOSIS — R269 Unspecified abnormalities of gait and mobility: Secondary | ICD-10-CM | POA: Diagnosis not present

## 2020-04-01 DIAGNOSIS — M25552 Pain in left hip: Secondary | ICD-10-CM | POA: Diagnosis not present

## 2020-04-10 ENCOUNTER — Emergency Department (HOSPITAL_COMMUNITY): Payer: 59

## 2020-04-10 ENCOUNTER — Other Ambulatory Visit: Payer: Self-pay

## 2020-04-10 ENCOUNTER — Encounter (HOSPITAL_COMMUNITY): Payer: Self-pay | Admitting: Emergency Medicine

## 2020-04-10 ENCOUNTER — Emergency Department (HOSPITAL_COMMUNITY)
Admission: EM | Admit: 2020-04-10 | Discharge: 2020-04-10 | Disposition: A | Discharge status: Home / Self Care | Payer: 59 | Attending: Emergency Medicine | Admitting: Emergency Medicine

## 2020-04-10 DIAGNOSIS — S8000XA Contusion of unspecified knee, initial encounter: Secondary | ICD-10-CM

## 2020-04-10 DIAGNOSIS — S52552A Other extraarticular fracture of lower end of left radius, initial encounter for closed fracture: Secondary | ICD-10-CM | POA: Insufficient documentation

## 2020-04-10 DIAGNOSIS — S62626A Displaced fracture of medial phalanx of right little finger, initial encounter for closed fracture: Secondary | ICD-10-CM | POA: Diagnosis not present

## 2020-04-10 DIAGNOSIS — S8001XA Contusion of right knee, initial encounter: Secondary | ICD-10-CM | POA: Insufficient documentation

## 2020-04-10 DIAGNOSIS — S0990XA Unspecified injury of head, initial encounter: Secondary | ICD-10-CM | POA: Insufficient documentation

## 2020-04-10 DIAGNOSIS — M7989 Other specified soft tissue disorders: Secondary | ICD-10-CM | POA: Diagnosis not present

## 2020-04-10 DIAGNOSIS — S8992XA Unspecified injury of left lower leg, initial encounter: Secondary | ICD-10-CM | POA: Diagnosis not present

## 2020-04-10 DIAGNOSIS — Y9355 Activity, bike riding: Secondary | ICD-10-CM | POA: Diagnosis not present

## 2020-04-10 DIAGNOSIS — Y92482 Bike path as the place of occurrence of the external cause: Secondary | ICD-10-CM | POA: Diagnosis not present

## 2020-04-10 DIAGNOSIS — S8002XA Contusion of left knee, initial encounter: Secondary | ICD-10-CM | POA: Insufficient documentation

## 2020-04-10 DIAGNOSIS — M25511 Pain in right shoulder: Secondary | ICD-10-CM | POA: Diagnosis not present

## 2020-04-10 DIAGNOSIS — S59912A Unspecified injury of left forearm, initial encounter: Secondary | ICD-10-CM | POA: Diagnosis not present

## 2020-04-10 DIAGNOSIS — S022XXB Fracture of nasal bones, initial encounter for open fracture: Secondary | ICD-10-CM | POA: Diagnosis not present

## 2020-04-10 DIAGNOSIS — Y999 Unspecified external cause status: Secondary | ICD-10-CM | POA: Diagnosis not present

## 2020-04-10 DIAGNOSIS — S8991XA Unspecified injury of right lower leg, initial encounter: Secondary | ICD-10-CM | POA: Diagnosis not present

## 2020-04-10 DIAGNOSIS — S62357A Nondisplaced fracture of shaft of fifth metacarpal bone, left hand, initial encounter for closed fracture: Secondary | ICD-10-CM | POA: Insufficient documentation

## 2020-04-10 DIAGNOSIS — S0181XA Laceration without foreign body of other part of head, initial encounter: Secondary | ICD-10-CM

## 2020-04-10 DIAGNOSIS — Z23 Encounter for immunization: Secondary | ICD-10-CM | POA: Diagnosis not present

## 2020-04-10 DIAGNOSIS — S52612A Displaced fracture of left ulna styloid process, initial encounter for closed fracture: Secondary | ICD-10-CM | POA: Diagnosis not present

## 2020-04-10 DIAGNOSIS — S52592A Other fractures of lower end of left radius, initial encounter for closed fracture: Secondary | ICD-10-CM | POA: Diagnosis not present

## 2020-04-10 DIAGNOSIS — M25561 Pain in right knee: Secondary | ICD-10-CM | POA: Diagnosis not present

## 2020-04-10 DIAGNOSIS — S52602A Unspecified fracture of lower end of left ulna, initial encounter for closed fracture: Secondary | ICD-10-CM | POA: Diagnosis not present

## 2020-04-10 DIAGNOSIS — Z20822 Contact with and (suspected) exposure to covid-19: Secondary | ICD-10-CM | POA: Insufficient documentation

## 2020-04-10 DIAGNOSIS — S0121XA Laceration without foreign body of nose, initial encounter: Secondary | ICD-10-CM | POA: Diagnosis not present

## 2020-04-10 DIAGNOSIS — S4991XA Unspecified injury of right shoulder and upper arm, initial encounter: Secondary | ICD-10-CM | POA: Diagnosis not present

## 2020-04-10 DIAGNOSIS — M25562 Pain in left knee: Secondary | ICD-10-CM | POA: Diagnosis not present

## 2020-04-10 DIAGNOSIS — Z043 Encounter for examination and observation following other accident: Secondary | ICD-10-CM | POA: Diagnosis not present

## 2020-04-10 DIAGNOSIS — S52502A Unspecified fracture of the lower end of left radius, initial encounter for closed fracture: Secondary | ICD-10-CM | POA: Diagnosis not present

## 2020-04-10 DIAGNOSIS — S022XXA Fracture of nasal bones, initial encounter for closed fracture: Secondary | ICD-10-CM | POA: Diagnosis not present

## 2020-04-10 LAB — BASIC METABOLIC PANEL
Anion gap: 13 (ref 5–15)
BUN: 25 mg/dL — ABNORMAL HIGH (ref 6–20)
CO2: 23 mmol/L (ref 22–32)
Calcium: 9.8 mg/dL (ref 8.9–10.3)
Chloride: 106 mmol/L (ref 98–111)
Creatinine, Ser: 1.67 mg/dL — ABNORMAL HIGH (ref 0.61–1.24)
GFR calc Af Amer: 52 mL/min — ABNORMAL LOW (ref >60)
GFR calc non Af Amer: 45 mL/min — ABNORMAL LOW (ref >60)
Glucose, Bld: 108 mg/dL — ABNORMAL HIGH (ref 70–99)
Potassium: 3.9 mmol/L (ref 3.5–5.1)
Sodium: 142 mmol/L (ref 135–145)

## 2020-04-10 LAB — CBC
HCT: 43.4 % (ref 39.0–52.0)
Hemoglobin: 14.3 g/dL (ref 13.0–17.0)
MCH: 30.4 pg (ref 26.0–34.0)
MCHC: 32.9 g/dL (ref 30.0–36.0)
MCV: 92.3 fL (ref 80.0–100.0)
Platelets: 211 10*3/uL (ref 150–400)
RBC: 4.7 MIL/uL (ref 4.22–5.81)
RDW: 12.4 % (ref 11.5–15.5)
WBC: 18.7 10*3/uL — ABNORMAL HIGH (ref 4.0–10.5)
nRBC: 0 % (ref 0.0–0.2)

## 2020-04-10 LAB — TYPE AND SCREEN
ABO/RH(D): B POS
Antibody Screen: NEGATIVE

## 2020-04-10 LAB — SARS CORONAVIRUS 2 BY RT PCR (HOSPITAL ORDER, PERFORMED IN ~~LOC~~ HOSPITAL LAB): SARS Coronavirus 2: NEGATIVE

## 2020-04-10 MED ORDER — AMOXICILLIN-POT CLAVULANATE 875-125 MG PO TABS: 1.0000 | ORAL_TABLET | Freq: Two times a day (BID) | ORAL | 0 refills | Status: DC

## 2020-04-10 MED ORDER — FENTANYL CITRATE (PF) 100 MCG/2ML IJ SOLN
100.0000 ug | Freq: Once | INTRAMUSCULAR | Status: AC
  Administered 2020-04-10: 100 ug via INTRAVENOUS
  Filled 2020-04-10: qty 2

## 2020-04-10 MED ORDER — HYDROCODONE-ACETAMINOPHEN 5-325 MG PO TABS: 1.0000 | ORAL_TABLET | Freq: Four times a day (QID) | ORAL | 0 refills | Status: DC | PRN

## 2020-04-10 MED ORDER — LIDOCAINE-EPINEPHRINE 1 %-1:100000 IJ SOLN
10.0000 mL | Freq: Once | INTRAMUSCULAR | Status: AC
  Administered 2020-04-10: 10 mL
  Filled 2020-04-10: qty 1

## 2020-04-10 MED ORDER — SODIUM CHLORIDE 0.9 % IV SOLN
3.0000 g | Freq: Once | INTRAVENOUS | Status: AC
  Administered 2020-04-10: 3 g via INTRAVENOUS
  Filled 2020-04-10: qty 8

## 2020-04-10 MED ORDER — TETANUS-DIPHTH-ACELL PERTUSSIS 5-2.5-18.5 LF-MCG/0.5 IM SUSP
0.5000 mL | Freq: Once | INTRAMUSCULAR | Status: AC
  Administered 2020-04-10: 0.5 mL via INTRAMUSCULAR
  Filled 2020-04-10: qty 0.5

## 2020-04-10 MED ORDER — ONDANSETRON HCL 4 MG PO TABS: 4.0000 mg | ORAL_TABLET | Freq: Four times a day (QID) | ORAL | 0 refills | Status: DC
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
## 2020-04-10 NOTE — ED Notes (Signed)
Patient verbalizes understanding of discharge instructions. Opportunity for questioning and answers were provided. Armband removed by staff, pt discharged from ED ambulatory w/ wife 

## 2020-04-10 NOTE — Progress Notes (Signed)
Orthopedic Tech Progress Note Patient Details:  [name] March 29, 1961 [id number]  Ortho Devices Type of Ortho Device: Arm sling, Finger splint, Sugartong splint Ortho Device/Splint Location: rue 5th finger splint, lue sugartong splint with long finger extension at drs request. Ortho Device/Splint Interventions: Ordered, Application, Adjustment   Post Interventions Patient Tolerated: Well Instructions Provided: Care of device, Adjustment of device   Karolee Stamps 04/10/2020, 8:38 PM

## 2020-04-10 NOTE — Discharge Instructions (Signed)
Dr. Fredna Dow would like you to call the office on Tuesday morning, they will arrange a time for you to come into the office or to have the surgically repaired on that day.  Please stay at home and quarantine away from others, you may use medications including Tylenol or ibuprofen for pain but please use hydrocodone, 1 or 2 tablets every 6 hours as needed for severe pain.  This may make you constipated so please take a stool softener if you take this medication.  Additionally ondansetron has been prescribed in case you become nauseated, you may take 1 tablet every 6 hours as needed for nausea  Please take Augmentin twice daily for the next 7 days to help prevent infection  Please follow-up with Dr. Wilburn Cornelia for suture removal as per his instructions  Return to the emergency department immediately for any severe worsening symptoms including increasing pain fever, changes in vision, severe headache, numbness or weakness.  1. Limited activity 2. Liquid and soft diet, advance as tolerated 3. May bathe and shower, keep incision dry for 3 days postop 4. Wound care - 1/2 str H2O2 and bacitracin ointment twice daily 5. Elevate Head of Bed 6. Ice compress for 24 hrs  Please contact [facility] ENT [phone]) for any additional concerns.
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
## 2020-04-13 ENCOUNTER — Other Ambulatory Visit: Payer: Self-pay | Admitting: Orthopedic Surgery

## 2020-04-13 ENCOUNTER — Other Ambulatory Visit: Payer: Self-pay

## 2020-04-13 ENCOUNTER — Ambulatory Visit (HOSPITAL_BASED_OUTPATIENT_CLINIC_OR_DEPARTMENT_OTHER)
Admission: RE | Admit: 2020-04-13 | Discharge: 2020-04-13 | Disposition: A | Discharge status: Home / Self Care | Payer: 59 | Source: Ambulatory Visit | Attending: Orthopedic Surgery | Admitting: Orthopedic Surgery

## 2020-04-13 ENCOUNTER — Encounter (HOSPITAL_BASED_OUTPATIENT_CLINIC_OR_DEPARTMENT_OTHER)
Admission: RE | Disposition: A | Discharge status: Home / Self Care | Payer: Self-pay | Source: Ambulatory Visit | Attending: Orthopedic Surgery

## 2020-04-13 ENCOUNTER — Encounter (HOSPITAL_BASED_OUTPATIENT_CLINIC_OR_DEPARTMENT_OTHER): Payer: Self-pay | Admitting: Orthopedic Surgery

## 2020-04-13 ENCOUNTER — Ambulatory Visit (HOSPITAL_BASED_OUTPATIENT_CLINIC_OR_DEPARTMENT_OTHER): Payer: 59 | Admitting: Certified Registered"

## 2020-04-13 DIAGNOSIS — S62357A Nondisplaced fracture of shaft of fifth metacarpal bone, left hand, initial encounter for closed fracture: Secondary | ICD-10-CM | POA: Diagnosis not present

## 2020-04-13 DIAGNOSIS — S52572A Other intraarticular fracture of lower end of left radius, initial encounter for closed fracture: Secondary | ICD-10-CM | POA: Insufficient documentation

## 2020-04-13 DIAGNOSIS — Z79899 Other long term (current) drug therapy: Secondary | ICD-10-CM | POA: Insufficient documentation

## 2020-04-13 DIAGNOSIS — S62626A Displaced fracture of medial phalanx of right little finger, initial encounter for closed fracture: Secondary | ICD-10-CM | POA: Diagnosis not present

## 2020-04-13 DIAGNOSIS — Z791 Long term (current) use of non-steroidal anti-inflammatories (NSAID): Secondary | ICD-10-CM | POA: Insufficient documentation

## 2020-04-13 DIAGNOSIS — F329 Major depressive disorder, single episode, unspecified: Secondary | ICD-10-CM | POA: Diagnosis not present

## 2020-04-13 DIAGNOSIS — S62624A Displaced fracture of medial phalanx of right ring finger, initial encounter for closed fracture: Secondary | ICD-10-CM | POA: Diagnosis not present

## 2020-04-13 DIAGNOSIS — I10 Essential (primary) hypertension: Secondary | ICD-10-CM | POA: Insufficient documentation

## 2020-04-13 DIAGNOSIS — S62327A Displaced fracture of shaft of fifth metacarpal bone, left hand, initial encounter for closed fracture: Secondary | ICD-10-CM | POA: Insufficient documentation

## 2020-04-13 DIAGNOSIS — M1990 Unspecified osteoarthritis, unspecified site: Secondary | ICD-10-CM | POA: Insufficient documentation

## 2020-04-13 DIAGNOSIS — E559 Vitamin D deficiency, unspecified: Secondary | ICD-10-CM | POA: Diagnosis not present

## 2020-04-13 DIAGNOSIS — G8918 Other acute postprocedural pain: Secondary | ICD-10-CM | POA: Diagnosis not present

## 2020-04-13 DIAGNOSIS — S52552A Other extraarticular fracture of lower end of left radius, initial encounter for closed fracture: Secondary | ICD-10-CM | POA: Diagnosis not present

## 2020-04-13 HISTORY — PX: OPEN REDUCTION INTERNAL FIXATION (ORIF) DISTAL RADIAL FRACTURE: SHX5989

## 2020-04-13 HISTORY — PX: OPEN REDUCTION INTERNAL FIXATION (ORIF) PROXIMAL PHALANX: SHX6235

## 2020-04-13 HISTORY — DX: Essential (primary) hypertension: I10

## 2020-04-13 HISTORY — PX: OPEN REDUCTION INTERNAL FIXATION (ORIF) METACARPAL: SHX6234

## 2020-04-13 SURGERY — OPEN REDUCTION INTERNAL FIXATION (ORIF) DISTAL RADIUS FRACTURE
Anesthesia: General | Site: Wrist | Laterality: Right

## 2020-04-13 MED ORDER — FENTANYL CITRATE (PF) 100 MCG/2ML IJ SOLN
INTRAMUSCULAR | Status: DC | PRN
Start: 2020-04-13 — End: 2020-04-13
  Administered 2020-04-13: 50 ug via INTRAVENOUS
  Administered 2020-04-13 (×2): 25 ug via INTRAVENOUS

## 2020-04-13 MED ORDER — FENTANYL CITRATE (PF) 100 MCG/2ML IJ SOLN
INTRAMUSCULAR | Status: AC
Start: 2020-04-13 — End: ?
  Filled 2020-04-13: qty 2

## 2020-04-13 MED ORDER — FENTANYL CITRATE (PF) 100 MCG/2ML IJ SOLN
INTRAMUSCULAR | Status: AC
  Filled 2020-04-13: qty 2

## 2020-04-13 MED ORDER — ONDANSETRON HCL 4 MG/2ML IJ SOLN
INTRAMUSCULAR | Status: DC | PRN
  Administered 2020-04-13: 4 mg via INTRAVENOUS

## 2020-04-13 MED ORDER — FENTANYL CITRATE (PF) 100 MCG/2ML IJ SOLN
100.0000 ug | Freq: Once | INTRAMUSCULAR | Status: AC
  Administered 2020-04-13: 100 ug via INTRAVENOUS

## 2020-04-13 MED ORDER — LIDOCAINE HCL (CARDIAC) PF 100 MG/5ML IV SOSY
PREFILLED_SYRINGE | INTRAVENOUS | Status: DC | PRN
  Administered 2020-04-13: 80 mg via INTRAVENOUS

## 2020-04-13 MED ORDER — DEXAMETHASONE SODIUM PHOSPHATE 10 MG/ML IJ SOLN
INTRAMUSCULAR | Status: AC
  Filled 2020-04-13: qty 1

## 2020-04-13 MED ORDER — MIDAZOLAM HCL 2 MG/2ML IJ SOLN
INTRAMUSCULAR | Status: AC
  Filled 2020-04-13: qty 2

## 2020-04-13 MED ORDER — ONDANSETRON HCL 4 MG/2ML IJ SOLN
INTRAMUSCULAR | Status: AC
  Filled 2020-04-13: qty 2

## 2020-04-13 MED ORDER — HYDROMORPHONE HCL 1 MG/ML IJ SOLN: 0.2500 mg | INTRAMUSCULAR | Status: DC | PRN

## 2020-04-13 MED ORDER — HYDROCODONE-ACETAMINOPHEN 5-325 MG PO TABS
ORAL_TABLET | ORAL | 0 refills | Status: DC
Start: 2020-04-13 — End: 2021-12-14

## 2020-04-13 MED ORDER — DEXAMETHASONE SODIUM PHOSPHATE 10 MG/ML IJ SOLN
INTRAMUSCULAR | Status: DC | PRN
  Administered 2020-04-13: 10 mg via INTRAVENOUS

## 2020-04-13 MED ORDER — EPHEDRINE SULFATE 50 MG/ML IJ SOLN
INTRAMUSCULAR | Status: DC | PRN
  Administered 2020-04-13 (×2): 10 mg via INTRAVENOUS
  Administered 2020-04-13: 15 mg via INTRAVENOUS

## 2020-04-13 MED ORDER — BUPIVACAINE HCL (PF) 0.25 % IJ SOLN
INTRAMUSCULAR | Status: DC | PRN
  Administered 2020-04-13: 12 mL

## 2020-04-13 MED ORDER — PHENYLEPHRINE HCL (PRESSORS) 10 MG/ML IV SOLN
INTRAVENOUS | Status: DC | PRN
  Administered 2020-04-13 (×3): 80 ug via INTRAVENOUS

## 2020-04-13 MED ORDER — BUPIVACAINE HCL (PF) 0.25 % IJ SOLN
INTRAMUSCULAR | Status: AC
  Filled 2020-04-13: qty 30

## 2020-04-13 MED ORDER — MIDAZOLAM HCL 2 MG/2ML IJ SOLN
2.0000 mg | Freq: Once | INTRAMUSCULAR | Status: AC
  Administered 2020-04-13: 2 mg via INTRAVENOUS

## 2020-04-13 MED ORDER — PROPOFOL 10 MG/ML IV BOLUS
INTRAVENOUS | Status: DC | PRN
  Administered 2020-04-13: 200 mg via INTRAVENOUS

## 2020-04-13 MED ORDER — 0.9 % SODIUM CHLORIDE (POUR BTL) OPTIME
TOPICAL | Status: DC | PRN
  Administered 2020-04-13: 400 mL

## 2020-04-13 MED ORDER — CEFAZOLIN SODIUM-DEXTROSE 2-4 GM/100ML-% IV SOLN
INTRAVENOUS | Status: AC
  Filled 2020-04-13: qty 100

## 2020-04-13 MED ORDER — ROPIVACAINE HCL 5 MG/ML IJ SOLN
INTRAMUSCULAR | Status: DC | PRN
  Administered 2020-04-13: 30 mL via PERINEURAL

## 2020-04-13 MED ORDER — CEFAZOLIN SODIUM-DEXTROSE 2-4 GM/100ML-% IV SOLN
2.0000 g | INTRAVENOUS | Status: AC
  Administered 2020-04-13: 2 g via INTRAVENOUS

## 2020-04-13 MED ORDER — LACTATED RINGERS IV SOLN: INTRAVENOUS | Status: DC

## 2020-04-13 MED ORDER — PHENYLEPHRINE 40 MCG/ML (10ML) SYRINGE FOR IV PUSH (FOR BLOOD PRESSURE SUPPORT)
PREFILLED_SYRINGE | INTRAVENOUS | Status: AC
  Filled 2020-04-13: qty 10

## 2020-04-13 MED ORDER — PROMETHAZINE HCL 25 MG/ML IJ SOLN: 6.2500 mg | INTRAMUSCULAR | Status: DC | PRN

## 2020-04-13 MED ORDER — OXYCODONE HCL 5 MG PO TABS: 5.0000 mg | ORAL_TABLET | Freq: Once | ORAL | Status: DC | PRN

## 2020-04-13 MED ORDER — AMISULPRIDE (ANTIEMETIC) 5 MG/2ML IV SOLN: 10.0000 mg | Freq: Once | INTRAVENOUS | Status: DC | PRN

## 2020-04-13 MED ORDER — OXYCODONE HCL 5 MG/5ML PO SOLN: 5.0000 mg | Freq: Once | ORAL | Status: DC | PRN

## 2020-04-13 MED ORDER — LIDOCAINE 2% (20 MG/ML) 5 ML SYRINGE
INTRAMUSCULAR | Status: AC
  Filled 2020-04-13: qty 5

## 2020-04-13 MED ORDER — PROPOFOL 10 MG/ML IV BOLUS
INTRAVENOUS | Status: AC
  Filled 2020-04-13: qty 20

## 2020-04-13 SURGICAL SUPPLY — 91 items
APL PRP STRL LF DISP 70% ISPRP (MISCELLANEOUS) ×6
BAND INSRT 18 STRL LF DISP RB (MISCELLANEOUS) ×3
BAND RUBBER #18 3X1/16 STRL (MISCELLANEOUS) ×4 IMPLANT
BIT DRILL 1.1X3.5 QK RELEA (DRILL) ×3 IMPLANT
BIT DRILL 1.1X3.5 QUICK RELEAS (DRILL) ×4
BIT DRILL 2.0 LNG QUCK RELEASE (BIT) ×3 IMPLANT
BIT DRILL 2.8 QUICK RELEASE (BIT) ×3 IMPLANT
BLADE MINI RND TIP GREEN BEAV (BLADE) IMPLANT
BLADE SURG 15 STRL LF DISP TIS (BLADE) ×12 IMPLANT
BLADE SURG 15 STRL SS (BLADE) ×16
BNDG CMPR 9X4 STRL LF SNTH (GAUZE/BANDAGES/DRESSINGS) ×3
BNDG ELASTIC 3X5.8 VLCR STR LF (GAUZE/BANDAGES/DRESSINGS) ×8 IMPLANT
BNDG ESMARK 4X9 LF (GAUZE/BANDAGES/DRESSINGS) ×4 IMPLANT
BNDG GAUZE ELAST 4 BULKY (GAUZE/BANDAGES/DRESSINGS) ×8 IMPLANT
BNDG PLASTER X FAST 3X3 WHT LF (CAST SUPPLIES) ×40 IMPLANT
BNDG PLSTR 9X3 FST ST WHT (CAST SUPPLIES) ×30
CHLORAPREP W/TINT 26 (MISCELLANEOUS) ×8 IMPLANT
CORD BIPOLAR FORCEPS 12FT (ELECTRODE) ×4 IMPLANT
COVER BACK TABLE 60X90IN (DRAPES) ×4 IMPLANT
COVER MAYO STAND STRL (DRAPES) ×8 IMPLANT
COVER WAND RF STERILE (DRAPES) IMPLANT
CUFF TOURN SGL QUICK 18X4 (TOURNIQUET CUFF) ×4 IMPLANT
CUFF TOURN SGL QUICK 24 (TOURNIQUET CUFF)
CUFF TRNQT CYL 24X4X16.5-23 (TOURNIQUET CUFF) IMPLANT
DRAPE EXTREMITY T 121X128X90 (DISPOSABLE) ×8 IMPLANT
DRAPE OEC MINIVIEW 54X84 (DRAPES) ×4 IMPLANT
DRAPE SURG 17X23 STRL (DRAPES) ×8 IMPLANT
DRILL 2.0 LNG QUICK RELEASE (BIT) ×4
DRILL 2.8 QUICK RELEASE (BIT) ×4
GAUZE SPONGE 4X4 12PLY STRL (GAUZE/BANDAGES/DRESSINGS) ×4 IMPLANT
GAUZE XEROFORM 1X8 LF (GAUZE/BANDAGES/DRESSINGS) ×8 IMPLANT
GLOVE BIO SURGEON STRL SZ7.5 (GLOVE) ×12 IMPLANT
GLOVE BIOGEL PI IND STRL 7.0 (GLOVE) ×15 IMPLANT
GLOVE BIOGEL PI IND STRL 8 (GLOVE) ×6 IMPLANT
GLOVE BIOGEL PI IND STRL 8.5 (GLOVE) ×6 IMPLANT
GLOVE BIOGEL PI INDICATOR 7.0 (GLOVE) ×5
GLOVE BIOGEL PI INDICATOR 8 (GLOVE) ×2
GLOVE BIOGEL PI INDICATOR 8.5 (GLOVE) ×2
GLOVE ECLIPSE 6.5 STRL STRAW (GLOVE) ×4 IMPLANT
GLOVE SURG ORTHO 8.0 STRL STRW (GLOVE) ×8 IMPLANT
GLOVE SURG SS PI 7.0 STRL IVOR (GLOVE) ×8 IMPLANT
GOWN STRL REUS W/ TWL LRG LVL3 (GOWN DISPOSABLE) ×6 IMPLANT
GOWN STRL REUS W/TWL LRG LVL3 (GOWN DISPOSABLE) ×8
GOWN STRL REUS W/TWL XL LVL3 (GOWN DISPOSABLE) ×16 IMPLANT
GUIDEWIRE ORTHO 0.054X6 (WIRE) ×12 IMPLANT
K-WIRE .035X4 (WIRE) ×12 IMPLANT
K-WIRE .045X4 (WIRE) ×4 IMPLANT
K-WIRE PROS .028 4 (WIRE) ×4 IMPLANT
NEEDLE HYPO 25X1 1.5 SAFETY (NEEDLE) ×4 IMPLANT
NS IRRIG 1000ML POUR BTL (IV SOLUTION) ×4 IMPLANT
PACK BASIN DAY SURGERY FS (CUSTOM PROCEDURE TRAY) ×4 IMPLANT
PAD CAST 3X4 CTTN HI CHSV (CAST SUPPLIES) ×3 IMPLANT
PAD CAST 4YDX4 CTTN HI CHSV (CAST SUPPLIES) ×3 IMPLANT
PADDING CAST ABS 4INX4YD NS (CAST SUPPLIES)
PADDING CAST ABS COTTON 4X4 ST (CAST SUPPLIES) IMPLANT
PADDING CAST COTTON 3X4 STRL (CAST SUPPLIES) ×4
PADDING CAST COTTON 4X4 STRL (CAST SUPPLIES) ×4
PLATE ACU LOC PROX STD LEFT (Plate) ×4 IMPLANT
SCREW BONE LAG 1.5X10MM HEXA (Screw) ×3 IMPLANT
SCREW BONE LAG 1.5X11MM HEXA (Screw) ×6 IMPLANT
SCREW CORT FT 18X2.3XLCK HEX (Screw) ×3 IMPLANT
SCREW CORT FT 20X2.3XLCK HEX (Screw) ×3 IMPLANT
SCREW CORTICAL LOCKING 2.3X18M (Screw) ×4 IMPLANT
SCREW CORTICAL LOCKING 2.3X20M (Screw) ×8 IMPLANT
SCREW CORTICAL LOCKING 2.3X22M (Screw) ×12 IMPLANT
SCREW FX20X2.3XSMTH LCK NS CRT (Screw) ×3 IMPLANT
SCREW FX22X2.3XLCK SMTH NS CRT (Screw) ×9 IMPLANT
SCREW HEX 3.5X15 NLCKG STRL (Screw) ×6 IMPLANT
SCREW HEX 3.5X15MM (Screw) ×8 IMPLANT
SCREW HEXALOBE NON-LOCK 3.5X14 (Screw) ×4 IMPLANT
SCREW HEXALOBE NON-LOCK 3.5X16 (Screw) ×4 IMPLANT
SCREW LAG HEXALOBE 1.5X10 (Screw) ×4 IMPLANT
SCREW LAG HEXALOBE 1.5X11 (Screw) ×8 IMPLANT
SCREW NON TOGG 2.3X22MM (Screw) ×4 IMPLANT
SLEEVE SCD COMPRESS KNEE MED (MISCELLANEOUS) ×4 IMPLANT
SPLINT PLASTER CAST XFAST 3X15 (CAST SUPPLIES) IMPLANT
SPLINT PLASTER CAST XFAST 4X15 (CAST SUPPLIES) IMPLANT
SPLINT PLASTER XTRA FAST SET 4 (CAST SUPPLIES)
SPLINT PLASTER XTRA FASTSET 3X (CAST SUPPLIES)
STOCKINETTE 4X48 STRL (DRAPES) ×8 IMPLANT
SUT CHROMIC 4 0 PS 2 18 (SUTURE) ×4 IMPLANT
SUT ETHILON 3 0 PS 1 (SUTURE) IMPLANT
SUT ETHILON 4 0 PS 2 18 (SUTURE) ×8 IMPLANT
SUT MERSILENE 4 0 P 3 (SUTURE) IMPLANT
SUT VIC AB 3-0 PS1 18 (SUTURE)
SUT VIC AB 3-0 PS1 18XBRD (SUTURE) IMPLANT
SUT VICRYL 4-0 PS2 18IN ABS (SUTURE) ×4 IMPLANT
SYR BULB EAR ULCER 3OZ GRN STR (SYRINGE) ×4 IMPLANT
SYR CONTROL 10ML LL (SYRINGE) ×4 IMPLANT
TOWEL GREEN STERILE FF (TOWEL DISPOSABLE) ×8 IMPLANT
UNDERPAD 30X36 HEAVY ABSORB (UNDERPADS AND DIAPERS) ×8 IMPLANT

## 2020-04-13 NOTE — Op Note (Signed)
I assisted Surgeon(s) and Role:    * Leanora Cover, MD - Primary    Daryll Brod, MD - Assisting on the Procedure(s): OPEN REDUCTION INTERNAL FIXATION (ORIF) LEFT DISTAL RADIUS FRACTURE OPEN REDUCTION INTERNAL FIXATION (ORIF) LEFT SMALL METACARPAL OPEN REDUCTION AND PINNING WITH APPLICATION OF EXTERNAL FIXATOR RIGHT PROXIMAL PHALANX on 04/13/2020.  I provided assistance on this case as follows: Set up approach open reduction internal fixation with screws in the metacarpal left hand with closure of the wound.  Separate incision approach for distal radius fracture left wrist with debridement of the fracture reduction of the fracture stabilization and fixation of the distal radius fracture with volar plate closure of the woun d.  On the right-hand side open incision for  reduction pin fixation and application of external fixator of the intra-articular fracture middle phalanx small finger with closure of the wound application of the dressing and splint on both the right and left side. Electronically signed by: Daryll Brod, MD Date: 04/13/2020 Time: 5:57 PM

## 2020-04-13 NOTE — Anesthesia Procedure Notes (Signed)
Anesthesia Regional Block: Supraclavicular block   Pre-Anesthetic Checklist: ,, timeout performed, Correct Patient, Correct Site, Correct Laterality, Correct Procedure, Correct Position, site marked, Risks and benefits discussed,  Surgical consent,  Pre-op evaluation,  At surgeon's request and post-op pain management  Laterality: Left  Prep: chloraprep       Needles:  Injection technique: Single-shot  Needle Type: Stimiplex     Needle Length: 9cm  Needle Gauge: 21     Additional Needles:   Procedures:,,,, ultrasound used (permanent image in chart),,,,  Narrative:  Start time: 04/13/2020 2:40 PM End time: 04/13/2020 2:45 PM Injection made incrementally with aspirations every 5 mL.  Performed by: Personally  Anesthesiologist: Lynda Rainwater, MD

## 2020-04-13 NOTE — Anesthesia Preprocedure Evaluation (Signed)
Anesthesia Evaluation  Patient identified by MRN, date of birth, ID band Patient awake    Reviewed: Allergy & Precautions, NPO status , Patient's Chart, lab work & pertinent test results  Airway Mallampati: II  TM Distance: >3 FB Neck ROM: Full    Dental no notable dental hx.    Pulmonary neg pulmonary ROS,    Pulmonary exam normal breath sounds clear to auscultation       Cardiovascular hypertension, Pt. on medications negative cardio ROS Normal cardiovascular exam Rhythm:Regular Rate:Normal     Neuro/Psych Depression negative neurological ROS  negative psych ROS   GI/Hepatic negative GI ROS, Neg liver ROS,   Endo/Other  negative endocrine ROS  Renal/GU negative Renal ROS  negative genitourinary   Musculoskeletal  (+) Arthritis , Osteoarthritis,    Abdominal   Peds negative pediatric ROS (+)  Hematology negative hematology ROS (+)   Anesthesia Other Findings   Reproductive/Obstetrics negative OB ROS                             Anesthesia Physical Anesthesia Plan  ASA: II  Anesthesia Plan: General   Post-op Pain Management:  Regional for Post-op pain   Induction: Intravenous  PONV Risk Score and Plan: 2 and Ondansetron, Midazolam and Treatment may vary due to age or medical condition  Airway Management Planned: LMA  Additional Equipment:   Intra-op Plan:   Post-operative Plan: Extubation in OR  Informed Consent: I have reviewed the patients History and Physical, chart, labs and discussed the procedure including the risks, benefits and alternatives for the proposed anesthesia with the patient or authorized representative who has indicated his/her understanding and acceptance.     Dental advisory given  Plan Discussed with: CRNA  Anesthesia Plan Comments:         Anesthesia Quick Evaluation

## 2020-04-13 NOTE — Anesthesia Procedure Notes (Signed)
Procedure Name: LMA Insertion Performed by: Verita Lamb, CRNA Pre-anesthesia Checklist: Patient identified, Emergency Drugs available, Suction available and Patient being monitored Patient Re-evaluated:Patient Re-evaluated prior to induction Oxygen Delivery Method: Circle system utilized Preoxygenation: Pre-oxygenation with 100% oxygen Induction Type: IV induction Ventilation: Mask ventilation without difficulty LMA: LMA inserted LMA Size: 5.0 Tube type: Oral Number of attempts: 1 Airway Equipment and Method: Bite block Placement Confirmation: positive ETCO2 Tube secured with: Tape Dental Injury: Teeth and Oropharynx as per pre-operative assessment

## 2020-04-13 NOTE — Transfer of Care (Signed)
Immediate Anesthesia Transfer of Care Note  Patient: [name]  Procedure(s) Performed: OPEN REDUCTION INTERNAL FIXATION (ORIF) LEFT DISTAL RADIUS FRACTURE (Left Wrist) OPEN REDUCTION INTERNAL FIXATION (ORIF) LEFT SMALL METACARPAL (Left Finger) OPEN REDUCTION AND PINNING WITH APPLICATION OF EXTERNAL FIXATOR RIGHT PROXIMAL PHALANX (Right Hand)  Patient Location: PACU  Anesthesia Type:General  Level of Consciousness: drowsy, patient cooperative and responds to stimulation  Airway & Oxygen Therapy: Patient Spontanous Breathing and Patient connected to face mask oxygen  Post-op Assessment: Report given to RN and Post -op Vital signs reviewed and stable  Post vital signs: Reviewed and stable  Last Vitals:  Vitals Value Taken Time  BP    Temp    Pulse 81 04/13/20 1801  Resp 13 04/13/20 1801  SpO2 100 % 04/13/20 1801  Vitals shown include unvalidated device data.  Last Pain:  Vitals:   04/13/20 1316  TempSrc: Oral  PainSc: 3       Patients Stated Pain Goal: 4 (28/00/34 9179)  Complications: No complications documented.

## 2020-04-13 NOTE — Discharge Instructions (Addendum)
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
## 2020-04-13 NOTE — Anesthesia Postprocedure Evaluation (Signed)
Anesthesia Post Note  Patient: [name]  Procedure(s) Performed: OPEN REDUCTION INTERNAL FIXATION (ORIF) LEFT DISTAL RADIUS FRACTURE (Left Wrist) OPEN REDUCTION INTERNAL FIXATION (ORIF) LEFT SMALL METACARPAL (Left Finger) OPEN REDUCTION AND PINNING WITH APPLICATION OF EXTERNAL FIXATOR RIGHT PROXIMAL PHALANX (Right Hand)     Patient location during evaluation: PACU Anesthesia Type: General Level of consciousness: awake and alert Pain management: pain level controlled Vital Signs Assessment: post-procedure vital signs reviewed and stable Respiratory status: spontaneous breathing, nonlabored ventilation and respiratory function stable Cardiovascular status: blood pressure returned to baseline and stable Postop Assessment: no apparent nausea or vomiting Anesthetic complications: no   No complications documented.  Last Vitals:  Vitals:   04/13/20 1801 04/13/20 1815  BP: 103/61 132/90  Pulse: 81 (!) 104  Resp: 13 17  Temp: 37 C   SpO2: 100% 98%    Last Pain:  Vitals:   04/13/20 1801  TempSrc:   PainSc: 0-No pain                 Lynda Rainwater
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
## 2020-04-13 NOTE — Progress Notes (Signed)
Assisted Dr. Miller with left, ultrasound guided, supraclavicular block. Side rails up, monitors on throughout procedure. See vital signs in flow sheet. Tolerated Procedure well. 

## 2020-04-14 ENCOUNTER — Encounter (HOSPITAL_BASED_OUTPATIENT_CLINIC_OR_DEPARTMENT_OTHER): Payer: Self-pay | Admitting: Orthopedic Surgery

## 2020-04-15 DIAGNOSIS — E785 Hyperlipidemia, unspecified: Secondary | ICD-10-CM | POA: Diagnosis not present

## 2020-04-20 DIAGNOSIS — M25532 Pain in left wrist: Secondary | ICD-10-CM | POA: Diagnosis not present

## 2020-04-20 DIAGNOSIS — S62626D Displaced fracture of medial phalanx of right little finger, subsequent encounter for fracture with routine healing: Secondary | ICD-10-CM | POA: Diagnosis not present

## 2020-04-20 DIAGNOSIS — M25432 Effusion, left wrist: Secondary | ICD-10-CM | POA: Diagnosis not present

## 2020-04-20 DIAGNOSIS — M25641 Stiffness of right hand, not elsewhere classified: Secondary | ICD-10-CM | POA: Diagnosis not present

## 2020-04-20 DIAGNOSIS — S62357D Nondisplaced fracture of shaft of fifth metacarpal bone, left hand, subsequent encounter for fracture with routine healing: Secondary | ICD-10-CM | POA: Diagnosis not present

## 2020-04-20 DIAGNOSIS — S62357A Nondisplaced fracture of shaft of fifth metacarpal bone, left hand, initial encounter for closed fracture: Secondary | ICD-10-CM | POA: Diagnosis not present

## 2020-04-20 DIAGNOSIS — S52552D Other extraarticular fracture of lower end of left radius, subsequent encounter for closed fracture with routine healing: Secondary | ICD-10-CM | POA: Diagnosis not present

## 2020-04-20 DIAGNOSIS — M25632 Stiffness of left wrist, not elsewhere classified: Secondary | ICD-10-CM | POA: Diagnosis not present

## 2020-05-11 DIAGNOSIS — S52552A Other extraarticular fracture of lower end of left radius, initial encounter for closed fracture: Secondary | ICD-10-CM | POA: Diagnosis not present

## 2020-05-11 DIAGNOSIS — S62626A Displaced fracture of medial phalanx of right little finger, initial encounter for closed fracture: Secondary | ICD-10-CM | POA: Diagnosis not present

## 2020-05-12 DIAGNOSIS — N486 Induration penis plastica: Secondary | ICD-10-CM | POA: Diagnosis not present

## 2020-05-18 ENCOUNTER — Other Ambulatory Visit (HOSPITAL_COMMUNITY): Payer: Self-pay | Admitting: Internal Medicine

## 2020-05-18 MED FILL — MELOXICAM 15 MG TABLET: 15 | 30 days supply | Qty: 30 | Fill #0

## 2020-05-18 MED FILL — METHOCARBAMOL 500 MG TABS: 500 | 22 days supply | Qty: 90 | Fill #1

## 2020-05-26 DIAGNOSIS — S62357D Nondisplaced fracture of shaft of fifth metacarpal bone, left hand, subsequent encounter for fracture with routine healing: Secondary | ICD-10-CM | POA: Diagnosis not present

## 2020-05-26 DIAGNOSIS — S52552D Other extraarticular fracture of lower end of left radius, subsequent encounter for closed fracture with routine healing: Secondary | ICD-10-CM | POA: Diagnosis not present

## 2020-05-26 DIAGNOSIS — S62626D Displaced fracture of medial phalanx of right little finger, subsequent encounter for fracture with routine healing: Secondary | ICD-10-CM | POA: Diagnosis not present

## 2020-05-31 DIAGNOSIS — S62626D Displaced fracture of medial phalanx of right little finger, subsequent encounter for fracture with routine healing: Secondary | ICD-10-CM | POA: Diagnosis not present

## 2020-05-31 DIAGNOSIS — S52552D Other extraarticular fracture of lower end of left radius, subsequent encounter for closed fracture with routine healing: Secondary | ICD-10-CM | POA: Diagnosis not present

## 2020-05-31 DIAGNOSIS — M25642 Stiffness of left hand, not elsewhere classified: Secondary | ICD-10-CM | POA: Diagnosis not present

## 2020-05-31 DIAGNOSIS — M79642 Pain in left hand: Secondary | ICD-10-CM | POA: Diagnosis not present

## 2020-05-31 DIAGNOSIS — M25632 Stiffness of left wrist, not elsewhere classified: Secondary | ICD-10-CM | POA: Diagnosis not present

## 2020-06-02 ENCOUNTER — Other Ambulatory Visit (HOSPITAL_COMMUNITY): Payer: Self-pay | Admitting: Orthopedic Surgery

## 2020-06-02 DIAGNOSIS — S62626D Displaced fracture of medial phalanx of right little finger, subsequent encounter for fracture with routine healing: Secondary | ICD-10-CM | POA: Diagnosis not present

## 2020-06-02 DIAGNOSIS — M25641 Stiffness of right hand, not elsewhere classified: Secondary | ICD-10-CM | POA: Diagnosis not present

## 2020-06-02 MED FILL — DOXYCYCLINE HYCLATE 100 MG: 100 | 7 days supply | Qty: 14 | Fill #0

## 2020-06-07 DIAGNOSIS — M79642 Pain in left hand: Secondary | ICD-10-CM | POA: Diagnosis not present

## 2020-06-07 DIAGNOSIS — M25642 Stiffness of left hand, not elsewhere classified: Secondary | ICD-10-CM | POA: Diagnosis not present

## 2020-06-07 DIAGNOSIS — S52552D Other extraarticular fracture of lower end of left radius, subsequent encounter for closed fracture with routine healing: Secondary | ICD-10-CM | POA: Diagnosis not present

## 2020-06-07 DIAGNOSIS — M25632 Stiffness of left wrist, not elsewhere classified: Secondary | ICD-10-CM | POA: Diagnosis not present

## 2020-06-07 DIAGNOSIS — S62626D Displaced fracture of medial phalanx of right little finger, subsequent encounter for fracture with routine healing: Secondary | ICD-10-CM | POA: Diagnosis not present

## 2020-06-16 DIAGNOSIS — M25632 Stiffness of left wrist, not elsewhere classified: Secondary | ICD-10-CM | POA: Diagnosis not present

## 2020-06-16 DIAGNOSIS — M25642 Stiffness of left hand, not elsewhere classified: Secondary | ICD-10-CM | POA: Diagnosis not present

## 2020-06-16 DIAGNOSIS — M79642 Pain in left hand: Secondary | ICD-10-CM | POA: Diagnosis not present

## 2020-06-16 DIAGNOSIS — S52552D Other extraarticular fracture of lower end of left radius, subsequent encounter for closed fracture with routine healing: Secondary | ICD-10-CM | POA: Diagnosis not present

## 2020-06-16 DIAGNOSIS — S62626D Displaced fracture of medial phalanx of right little finger, subsequent encounter for fracture with routine healing: Secondary | ICD-10-CM | POA: Diagnosis not present

## 2020-06-16 DIAGNOSIS — S62357D Nondisplaced fracture of shaft of fifth metacarpal bone, left hand, subsequent encounter for fracture with routine healing: Secondary | ICD-10-CM | POA: Diagnosis not present

## 2020-06-17 ENCOUNTER — Other Ambulatory Visit (HOSPITAL_COMMUNITY): Payer: Self-pay | Admitting: Internal Medicine

## 2020-06-17 MED FILL — DULOXETINE HCL 60 MG CPEP: 60 | 90 days supply | Qty: 90 | Fill #0

## 2020-06-17 MED FILL — OLMESARTAN MEDOXOMIL 40 MG: 40 | 90 days supply | Qty: 90 | Fill #2

## 2020-06-23 DIAGNOSIS — R29898 Other symptoms and signs involving the musculoskeletal system: Secondary | ICD-10-CM | POA: Diagnosis not present

## 2020-06-23 DIAGNOSIS — M79642 Pain in left hand: Secondary | ICD-10-CM | POA: Diagnosis not present

## 2020-06-23 DIAGNOSIS — S52552D Other extraarticular fracture of lower end of left radius, subsequent encounter for closed fracture with routine healing: Secondary | ICD-10-CM | POA: Diagnosis not present

## 2020-06-23 DIAGNOSIS — M25642 Stiffness of left hand, not elsewhere classified: Secondary | ICD-10-CM | POA: Diagnosis not present

## 2020-06-23 DIAGNOSIS — M25632 Stiffness of left wrist, not elsewhere classified: Secondary | ICD-10-CM | POA: Diagnosis not present

## 2020-06-28 ENCOUNTER — Other Ambulatory Visit (HOSPITAL_COMMUNITY): Payer: Self-pay | Admitting: Internal Medicine

## 2020-06-28 MED FILL — ATORVASTATIN CALCIUM 20 MG: 20 | 90 days supply | Qty: 90 | Fill #0

## 2020-06-30 DIAGNOSIS — M79642 Pain in left hand: Secondary | ICD-10-CM | POA: Diagnosis not present

## 2020-06-30 DIAGNOSIS — R29898 Other symptoms and signs involving the musculoskeletal system: Secondary | ICD-10-CM | POA: Diagnosis not present

## 2020-06-30 DIAGNOSIS — M25632 Stiffness of left wrist, not elsewhere classified: Secondary | ICD-10-CM | POA: Diagnosis not present

## 2020-06-30 DIAGNOSIS — M25642 Stiffness of left hand, not elsewhere classified: Secondary | ICD-10-CM | POA: Diagnosis not present

## 2020-06-30 DIAGNOSIS — S52552D Other extraarticular fracture of lower end of left radius, subsequent encounter for closed fracture with routine healing: Secondary | ICD-10-CM | POA: Diagnosis not present

## 2020-07-07 DIAGNOSIS — M25632 Stiffness of left wrist, not elsewhere classified: Secondary | ICD-10-CM | POA: Diagnosis not present

## 2020-07-07 DIAGNOSIS — M79642 Pain in left hand: Secondary | ICD-10-CM | POA: Diagnosis not present

## 2020-07-07 DIAGNOSIS — R29898 Other symptoms and signs involving the musculoskeletal system: Secondary | ICD-10-CM | POA: Diagnosis not present

## 2020-07-07 DIAGNOSIS — M25642 Stiffness of left hand, not elsewhere classified: Secondary | ICD-10-CM | POA: Diagnosis not present

## 2020-07-07 DIAGNOSIS — S52552D Other extraarticular fracture of lower end of left radius, subsequent encounter for closed fracture with routine healing: Secondary | ICD-10-CM | POA: Diagnosis not present

## 2020-07-14 DIAGNOSIS — S52552D Other extraarticular fracture of lower end of left radius, subsequent encounter for closed fracture with routine healing: Secondary | ICD-10-CM | POA: Diagnosis not present

## 2020-07-14 DIAGNOSIS — R29898 Other symptoms and signs involving the musculoskeletal system: Secondary | ICD-10-CM | POA: Diagnosis not present

## 2020-07-14 DIAGNOSIS — M79642 Pain in left hand: Secondary | ICD-10-CM | POA: Diagnosis not present

## 2020-07-14 DIAGNOSIS — M25632 Stiffness of left wrist, not elsewhere classified: Secondary | ICD-10-CM | POA: Diagnosis not present

## 2020-07-14 DIAGNOSIS — M25642 Stiffness of left hand, not elsewhere classified: Secondary | ICD-10-CM | POA: Diagnosis not present

## 2020-07-19 DIAGNOSIS — M79642 Pain in left hand: Secondary | ICD-10-CM | POA: Diagnosis not present

## 2020-07-19 DIAGNOSIS — M25632 Stiffness of left wrist, not elsewhere classified: Secondary | ICD-10-CM | POA: Diagnosis not present

## 2020-07-19 DIAGNOSIS — M25642 Stiffness of left hand, not elsewhere classified: Secondary | ICD-10-CM | POA: Diagnosis not present

## 2020-07-19 DIAGNOSIS — S52552D Other extraarticular fracture of lower end of left radius, subsequent encounter for closed fracture with routine healing: Secondary | ICD-10-CM | POA: Diagnosis not present

## 2020-07-19 DIAGNOSIS — R29898 Other symptoms and signs involving the musculoskeletal system: Secondary | ICD-10-CM | POA: Diagnosis not present

## 2020-07-28 DIAGNOSIS — S52552D Other extraarticular fracture of lower end of left radius, subsequent encounter for closed fracture with routine healing: Secondary | ICD-10-CM | POA: Diagnosis not present

## 2020-07-28 DIAGNOSIS — S62626D Displaced fracture of medial phalanx of right little finger, subsequent encounter for fracture with routine healing: Secondary | ICD-10-CM | POA: Diagnosis not present

## 2020-07-28 DIAGNOSIS — S62357D Nondisplaced fracture of shaft of fifth metacarpal bone, left hand, subsequent encounter for fracture with routine healing: Secondary | ICD-10-CM | POA: Diagnosis not present

## 2020-07-29 DIAGNOSIS — S52552D Other extraarticular fracture of lower end of left radius, subsequent encounter for closed fracture with routine healing: Secondary | ICD-10-CM | POA: Diagnosis not present

## 2020-07-29 DIAGNOSIS — M25642 Stiffness of left hand, not elsewhere classified: Secondary | ICD-10-CM | POA: Diagnosis not present

## 2020-07-29 DIAGNOSIS — R29898 Other symptoms and signs involving the musculoskeletal system: Secondary | ICD-10-CM | POA: Diagnosis not present

## 2020-07-29 DIAGNOSIS — M25632 Stiffness of left wrist, not elsewhere classified: Secondary | ICD-10-CM | POA: Diagnosis not present

## 2020-08-03 DIAGNOSIS — Z20822 Contact with and (suspected) exposure to covid-19: Secondary | ICD-10-CM | POA: Diagnosis not present

## 2020-08-18 DIAGNOSIS — M25632 Stiffness of left wrist, not elsewhere classified: Secondary | ICD-10-CM | POA: Diagnosis not present

## 2020-08-18 DIAGNOSIS — M25642 Stiffness of left hand, not elsewhere classified: Secondary | ICD-10-CM | POA: Diagnosis not present

## 2020-08-18 DIAGNOSIS — R29898 Other symptoms and signs involving the musculoskeletal system: Secondary | ICD-10-CM | POA: Diagnosis not present

## 2020-08-18 DIAGNOSIS — S52552D Other extraarticular fracture of lower end of left radius, subsequent encounter for closed fracture with routine healing: Secondary | ICD-10-CM | POA: Diagnosis not present

## 2020-08-30 DIAGNOSIS — L309 Dermatitis, unspecified: Secondary | ICD-10-CM | POA: Diagnosis not present

## 2020-08-30 DIAGNOSIS — D225 Melanocytic nevi of trunk: Secondary | ICD-10-CM | POA: Diagnosis not present

## 2020-08-30 DIAGNOSIS — D2261 Melanocytic nevi of right upper limb, including shoulder: Secondary | ICD-10-CM | POA: Diagnosis not present

## 2020-08-30 DIAGNOSIS — D171 Benign lipomatous neoplasm of skin and subcutaneous tissue of trunk: Secondary | ICD-10-CM | POA: Diagnosis not present

## 2020-09-15 MED FILL — DULOXETINE HCL 60 MG CPEP: 60 | 90 days supply | Qty: 90 | Fill #1

## 2020-09-15 MED FILL — OLMESARTAN MEDOXOMIL 40 MG: 40 | 90 days supply | Qty: 90 | Fill #3

## 2020-11-25 DIAGNOSIS — E785 Hyperlipidemia, unspecified: Secondary | ICD-10-CM | POA: Diagnosis not present

## 2020-11-25 DIAGNOSIS — R7301 Impaired fasting glucose: Secondary | ICD-10-CM | POA: Diagnosis not present

## 2020-11-25 DIAGNOSIS — Z125 Encounter for screening for malignant neoplasm of prostate: Secondary | ICD-10-CM | POA: Diagnosis not present

## 2020-11-25 DIAGNOSIS — E559 Vitamin D deficiency, unspecified: Secondary | ICD-10-CM | POA: Diagnosis not present

## 2020-12-08 ENCOUNTER — Other Ambulatory Visit (HOSPITAL_COMMUNITY): Payer: Self-pay | Admitting: *Deleted

## 2020-12-08 ENCOUNTER — Ambulatory Visit (HOSPITAL_COMMUNITY)
Admission: RE | Admit: 2020-12-08 | Discharge: 2020-12-08 | Disposition: A | Discharge status: Home / Self Care | Payer: Self-pay | Source: Ambulatory Visit | Attending: Cardiology | Admitting: Cardiology

## 2020-12-08 DIAGNOSIS — F339 Major depressive disorder, recurrent, unspecified: Secondary | ICD-10-CM | POA: Diagnosis not present

## 2020-12-08 DIAGNOSIS — Z0000 Encounter for general adult medical examination without abnormal findings: Secondary | ICD-10-CM | POA: Diagnosis not present

## 2020-12-08 DIAGNOSIS — R82998 Other abnormal findings in urine: Secondary | ICD-10-CM | POA: Diagnosis not present

## 2020-12-08 DIAGNOSIS — Z1339 Encounter for screening examination for other mental health and behavioral disorders: Secondary | ICD-10-CM | POA: Diagnosis not present

## 2020-12-08 DIAGNOSIS — E785 Hyperlipidemia, unspecified: Secondary | ICD-10-CM | POA: Diagnosis not present

## 2020-12-08 DIAGNOSIS — F3340 Major depressive disorder, recurrent, in remission, unspecified: Secondary | ICD-10-CM | POA: Diagnosis not present

## 2020-12-08 DIAGNOSIS — N486 Induration penis plastica: Secondary | ICD-10-CM | POA: Diagnosis not present

## 2020-12-08 DIAGNOSIS — Z1331 Encounter for screening for depression: Secondary | ICD-10-CM | POA: Diagnosis not present

## 2020-12-08 DIAGNOSIS — S62606D Fracture of unspecified phalanx of right little finger, subsequent encounter for fracture with routine healing: Secondary | ICD-10-CM | POA: Diagnosis not present

## 2020-12-08 DIAGNOSIS — M25552 Pain in left hip: Secondary | ICD-10-CM | POA: Diagnosis not present

## 2020-12-08 DIAGNOSIS — R7301 Impaired fasting glucose: Secondary | ICD-10-CM | POA: Diagnosis not present

## 2020-12-08 DIAGNOSIS — I10 Essential (primary) hypertension: Secondary | ICD-10-CM | POA: Diagnosis not present

## 2020-12-15 ENCOUNTER — Other Ambulatory Visit (HOSPITAL_COMMUNITY): Payer: Self-pay | Admitting: Internal Medicine

## 2020-12-15 ENCOUNTER — Other Ambulatory Visit (HOSPITAL_COMMUNITY): Payer: Self-pay

## 2020-12-15 MED FILL — Duloxetine HCl Enteric Coated Pellets Cap 60 MG (Base Eq): ORAL | 90 days supply | Qty: 90 | Fill #0 | Status: AC

## 2020-12-16 ENCOUNTER — Other Ambulatory Visit (HOSPITAL_COMMUNITY): Payer: Self-pay

## 2020-12-17 ENCOUNTER — Other Ambulatory Visit (HOSPITAL_COMMUNITY): Payer: Self-pay | Admitting: Internal Medicine

## 2020-12-17 ENCOUNTER — Other Ambulatory Visit (HOSPITAL_COMMUNITY): Payer: Self-pay

## 2020-12-17 MED ORDER — OLMESARTAN MEDOXOMIL 40 MG PO TABS
ORAL_TABLET | ORAL | 3 refills | Status: DC
Start: 2020-12-17 — End: 2021-12-15
  Filled 2020-12-17: qty 90, 90d supply, fill #0
  Filled 2021-03-18: qty 90, 90d supply, fill #1
  Filled 2021-06-22: qty 90, 90d supply, fill #2
  Filled 2021-09-28: qty 90, 90d supply, fill #3

## 2020-12-17 MED ORDER — DULOXETINE HCL 30 MG PO CPEP
ORAL_CAPSULE | ORAL | 3 refills | Status: DC
Start: 2020-12-17 — End: 2021-12-14
  Filled 2020-12-17: qty 90, 90d supply, fill #0

## 2020-12-22 ENCOUNTER — Other Ambulatory Visit (HOSPITAL_COMMUNITY): Payer: Self-pay

## 2021-03-16 DIAGNOSIS — N486 Induration penis plastica: Secondary | ICD-10-CM | POA: Diagnosis not present

## 2021-03-17 ENCOUNTER — Other Ambulatory Visit (HOSPITAL_COMMUNITY): Payer: Self-pay

## 2021-03-18 ENCOUNTER — Other Ambulatory Visit (HOSPITAL_COMMUNITY): Payer: Self-pay

## 2021-04-25 ENCOUNTER — Other Ambulatory Visit (HOSPITAL_COMMUNITY): Payer: Self-pay

## 2021-04-25 MED ORDER — XIAFLEX 0.9 MG IJ SOLR
INTRAMUSCULAR | 3 refills | Status: DC
  Filled 2021-04-25: qty 2, 30d supply, fill #0
  Filled 2021-06-02: qty 2, 30d supply, fill #1

## 2021-04-26 ENCOUNTER — Other Ambulatory Visit (HOSPITAL_COMMUNITY): Payer: Self-pay

## 2021-05-03 ENCOUNTER — Other Ambulatory Visit (HOSPITAL_COMMUNITY): Payer: Self-pay

## 2021-05-05 ENCOUNTER — Other Ambulatory Visit (HOSPITAL_COMMUNITY): Payer: Self-pay

## 2021-05-12 ENCOUNTER — Telehealth: Payer: Self-pay | Admitting: Medical

## 2021-05-12 NOTE — Telephone Encounter (Signed)
error 

## 2021-05-12 NOTE — Telephone Encounter (Signed)
Error.  I accidentally opened a telephone message on wrong patient.

## 2021-05-13 ENCOUNTER — Other Ambulatory Visit (HOSPITAL_COMMUNITY): Payer: Self-pay

## 2021-05-17 ENCOUNTER — Other Ambulatory Visit (HOSPITAL_COMMUNITY): Payer: Self-pay

## 2021-05-18 ENCOUNTER — Other Ambulatory Visit (HOSPITAL_COMMUNITY): Payer: Self-pay

## 2021-05-20 DIAGNOSIS — H5213 Myopia, bilateral: Secondary | ICD-10-CM | POA: Diagnosis not present

## 2021-05-30 DIAGNOSIS — N486 Induration penis plastica: Secondary | ICD-10-CM | POA: Diagnosis not present

## 2021-06-01 DIAGNOSIS — N486 Induration penis plastica: Secondary | ICD-10-CM | POA: Diagnosis not present

## 2021-06-02 ENCOUNTER — Other Ambulatory Visit (HOSPITAL_COMMUNITY): Payer: Self-pay

## 2021-06-06 ENCOUNTER — Other Ambulatory Visit (HOSPITAL_COMMUNITY): Payer: Self-pay

## 2021-06-07 ENCOUNTER — Other Ambulatory Visit (HOSPITAL_COMMUNITY): Payer: Self-pay

## 2021-06-08 DIAGNOSIS — R269 Unspecified abnormalities of gait and mobility: Secondary | ICD-10-CM | POA: Diagnosis not present

## 2021-06-08 DIAGNOSIS — M25552 Pain in left hip: Secondary | ICD-10-CM | POA: Diagnosis not present

## 2021-06-13 ENCOUNTER — Other Ambulatory Visit (HOSPITAL_COMMUNITY): Payer: Self-pay

## 2021-06-15 ENCOUNTER — Other Ambulatory Visit (HOSPITAL_COMMUNITY): Payer: Self-pay

## 2021-06-16 ENCOUNTER — Other Ambulatory Visit (HOSPITAL_COMMUNITY): Payer: Self-pay

## 2021-06-22 ENCOUNTER — Other Ambulatory Visit (HOSPITAL_COMMUNITY): Payer: Self-pay

## 2021-06-23 ENCOUNTER — Other Ambulatory Visit (HOSPITAL_COMMUNITY): Payer: Self-pay

## 2021-07-13 DIAGNOSIS — N486 Induration penis plastica: Secondary | ICD-10-CM | POA: Diagnosis not present

## 2021-07-15 DIAGNOSIS — N486 Induration penis plastica: Secondary | ICD-10-CM | POA: Diagnosis not present

## 2021-09-28 ENCOUNTER — Other Ambulatory Visit (HOSPITAL_COMMUNITY): Payer: Self-pay

## 2021-09-30 ENCOUNTER — Other Ambulatory Visit (HOSPITAL_COMMUNITY): Payer: Self-pay

## 2021-10-23 IMAGING — CT CT HEAD W/O CM
4 series · 16 of 47 positions shown, 18 images · non-contrast
Comparison: None.

CLINICAL DATA: Facial trauma

EXAM:
CT HEAD WITHOUT CONTRAST
TECHNIQUE: Contiguous axial images were obtained from the base of the skull
through the vertex without intravenous contrast.

[Series 3: head without · axial · non-contrast · 0.48mm/px · z∈[-60,+60]mm · 7 of 32 slices shown, 9 images]
[im 4/32  brain]
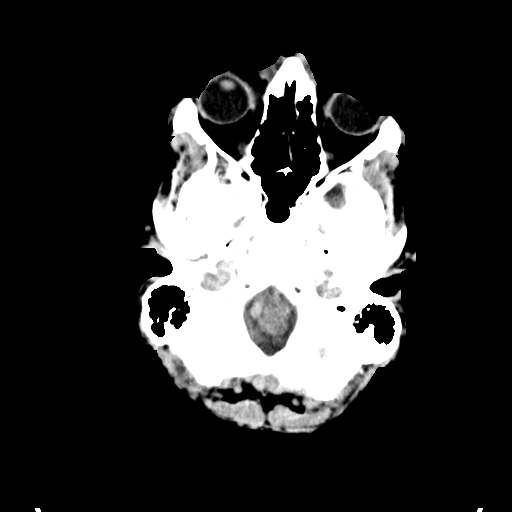
[im 4/32  bone]
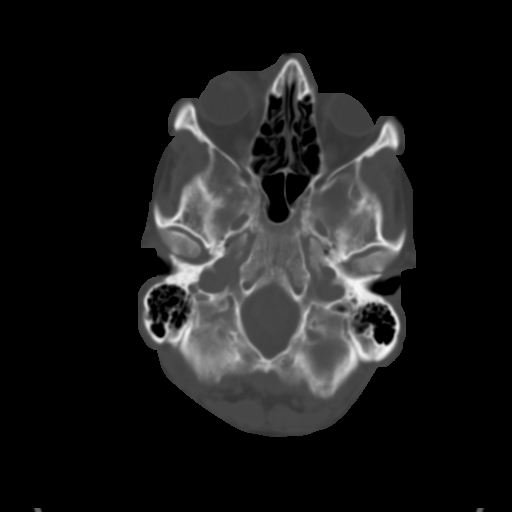
[im 8/32  brain]
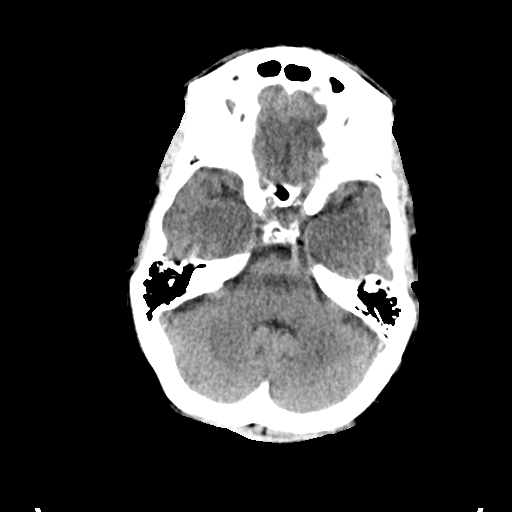
[im 12/32  brain]
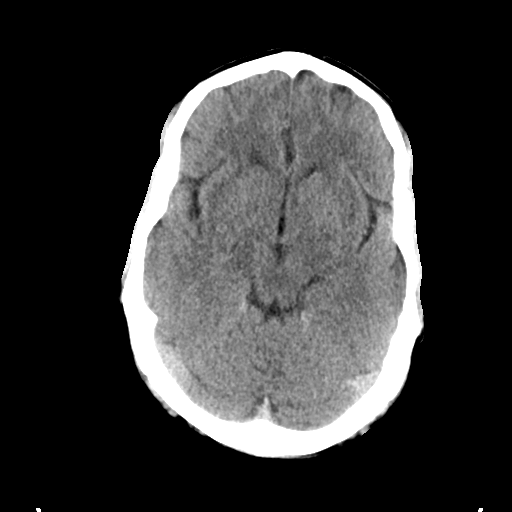
[im 16/32  brain]
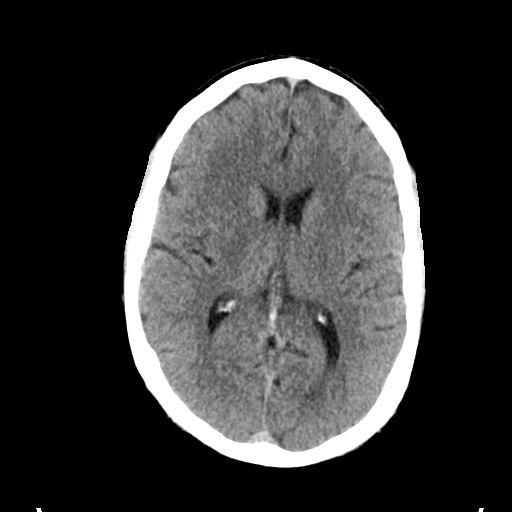
[im 20/32  brain]
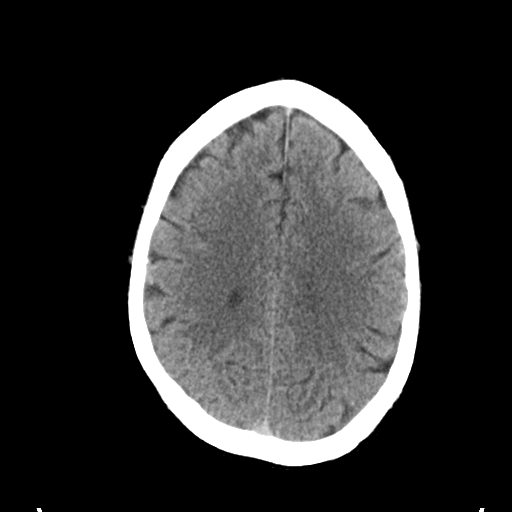
[im 20/32  bone]
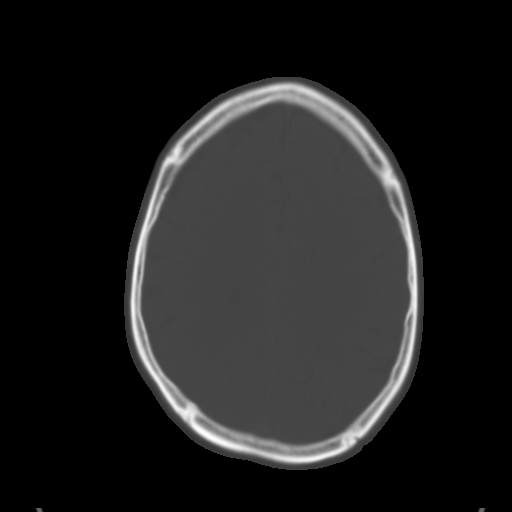
[im 24/32  brain]
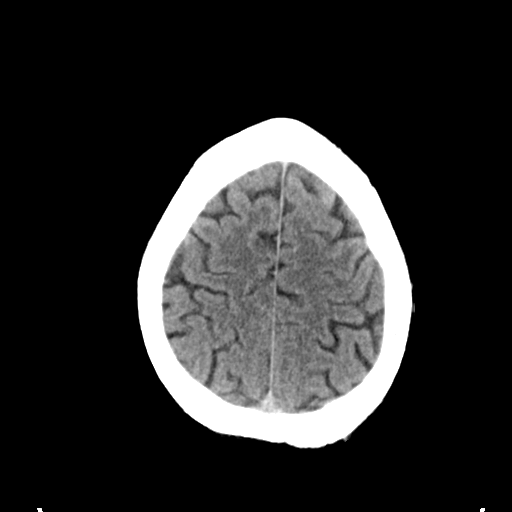
[im 28/32  brain]
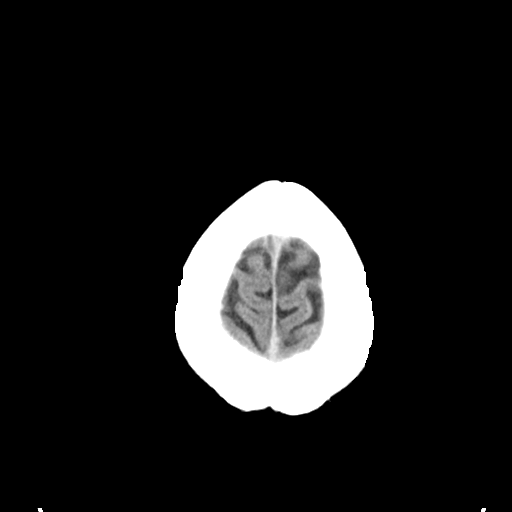

[Series 4: head bone · axial · 0.48mm/px · z∈[-61,-29]mm · 3 of 80 slices shown]
[im 8/80  bone]
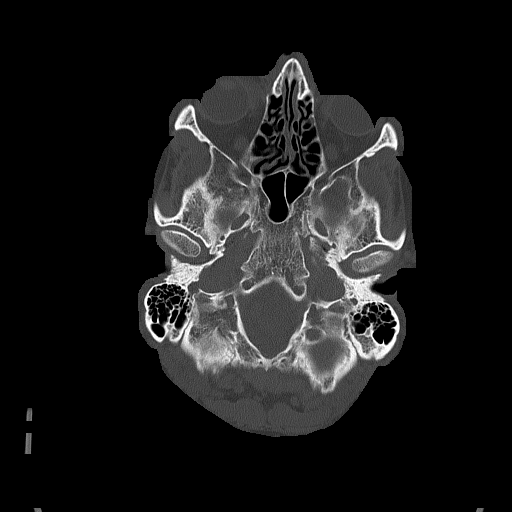
[im 16/80  bone]
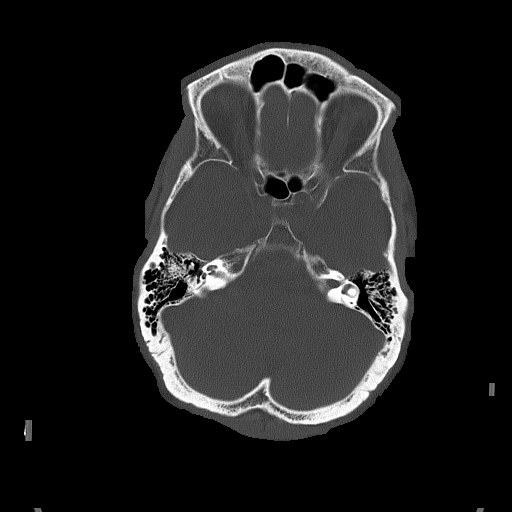
[im 24/80  bone]
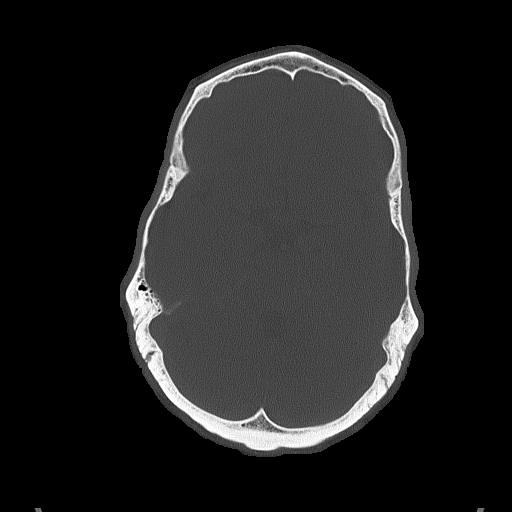

[Series 5: head without cor · coronal · non-contrast · 0.31mm/px · 3 of 69 slices shown]
[im 23/69  brain]
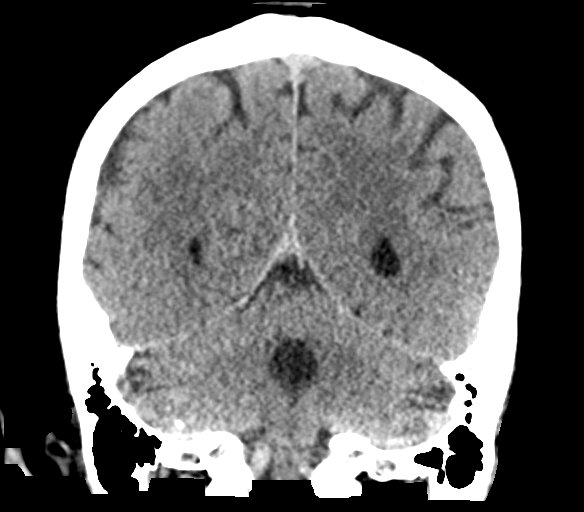
[im 31/69  brain]
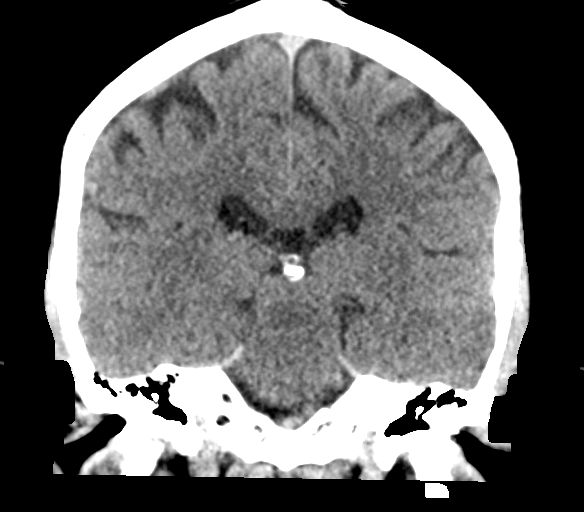
[im 38/69  brain]
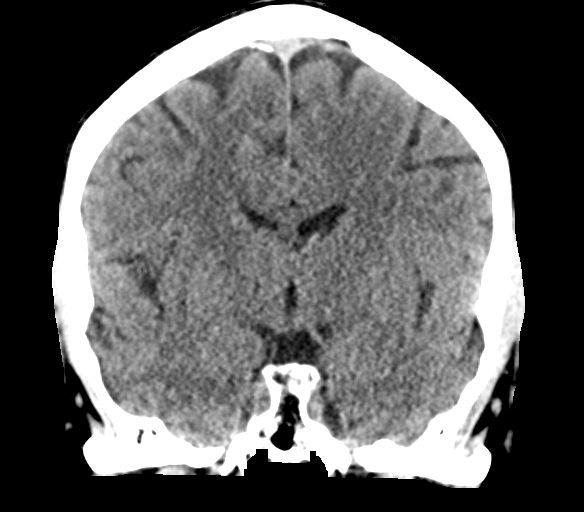

[Series 6: head without sag · sagittal · non-contrast · 0.33mm/px · 3 of 54 slices shown]
[im 18/54  brain]
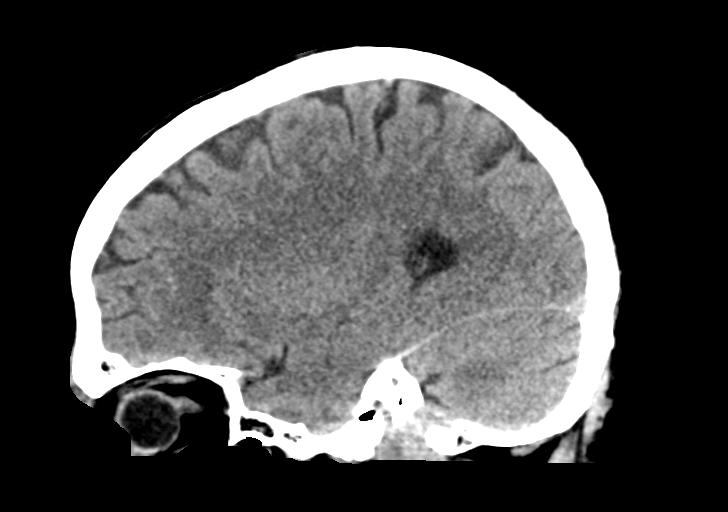
[im 27/54  brain]
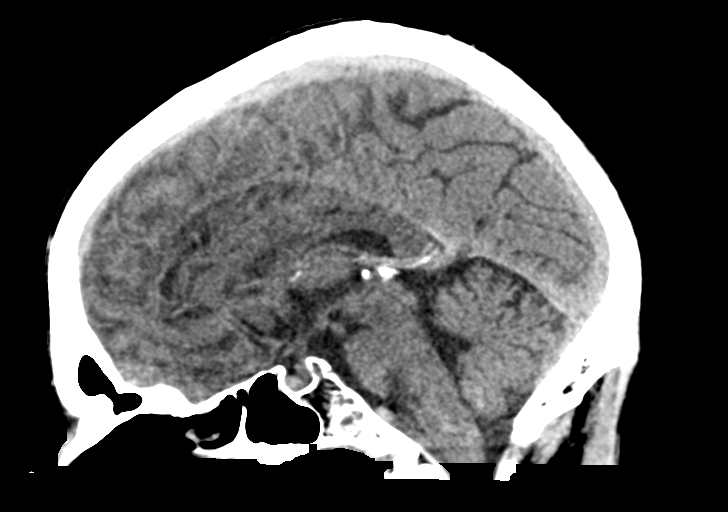
[im 36/54  brain]
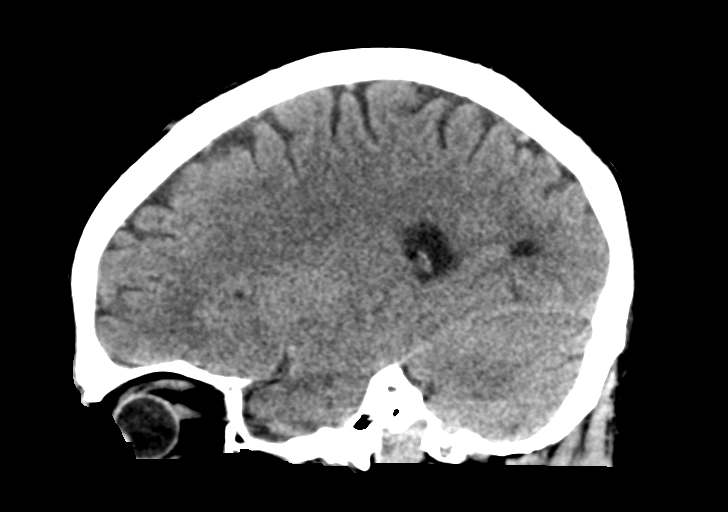

[16 of 47 positions shown; findings below may reference images not displayed]

FINDINGS: Brain: Normal anatomic configuration. No abnormal intra or
extra-axial mass lesion or fluid collection. No abnormal mass effect
or midline shift. No evidence of acute intracranial hemorrhage or
infarct. Ventricular size is normal. Cerebellum unremarkable.

Vascular: Unremarkable

Skull: Intact

Sinuses/Orbits: Paranasal sinuses are clear. Orbits are
unremarkable.

Other: Mastoid air cells and middle ear cavities are clear.
Fractures of the nasal bones are suspected, but not fully included
on this examination. There is moderate soft tissue swelling
superficial to the right supraorbital ridge.
IMPRESSION: 1. No evidence of acute intracranial injury.
2. Fractures of the nasal bones are suspected, but not fully
included on this examination. Dedicated CT imaging of the facial
bones is recommended for further evaluation.
3. Moderate soft tissue swelling superficial to the right
supraorbital ridge. No calvarial fracture
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
## 2021-10-23 IMAGING — DX DG SHOULDER 2+V PORT*R*
1 series · 3 of 3 positions shown · non-contrast
Comparison: None.

CLINICAL DATA: Fall, right shoulder pain

EXAM:
PORTABLE RIGHT SHOULDER

[Series 1: shoulder · 0.14mm/px · 3 of 3 slices shown]
[im 1/3]
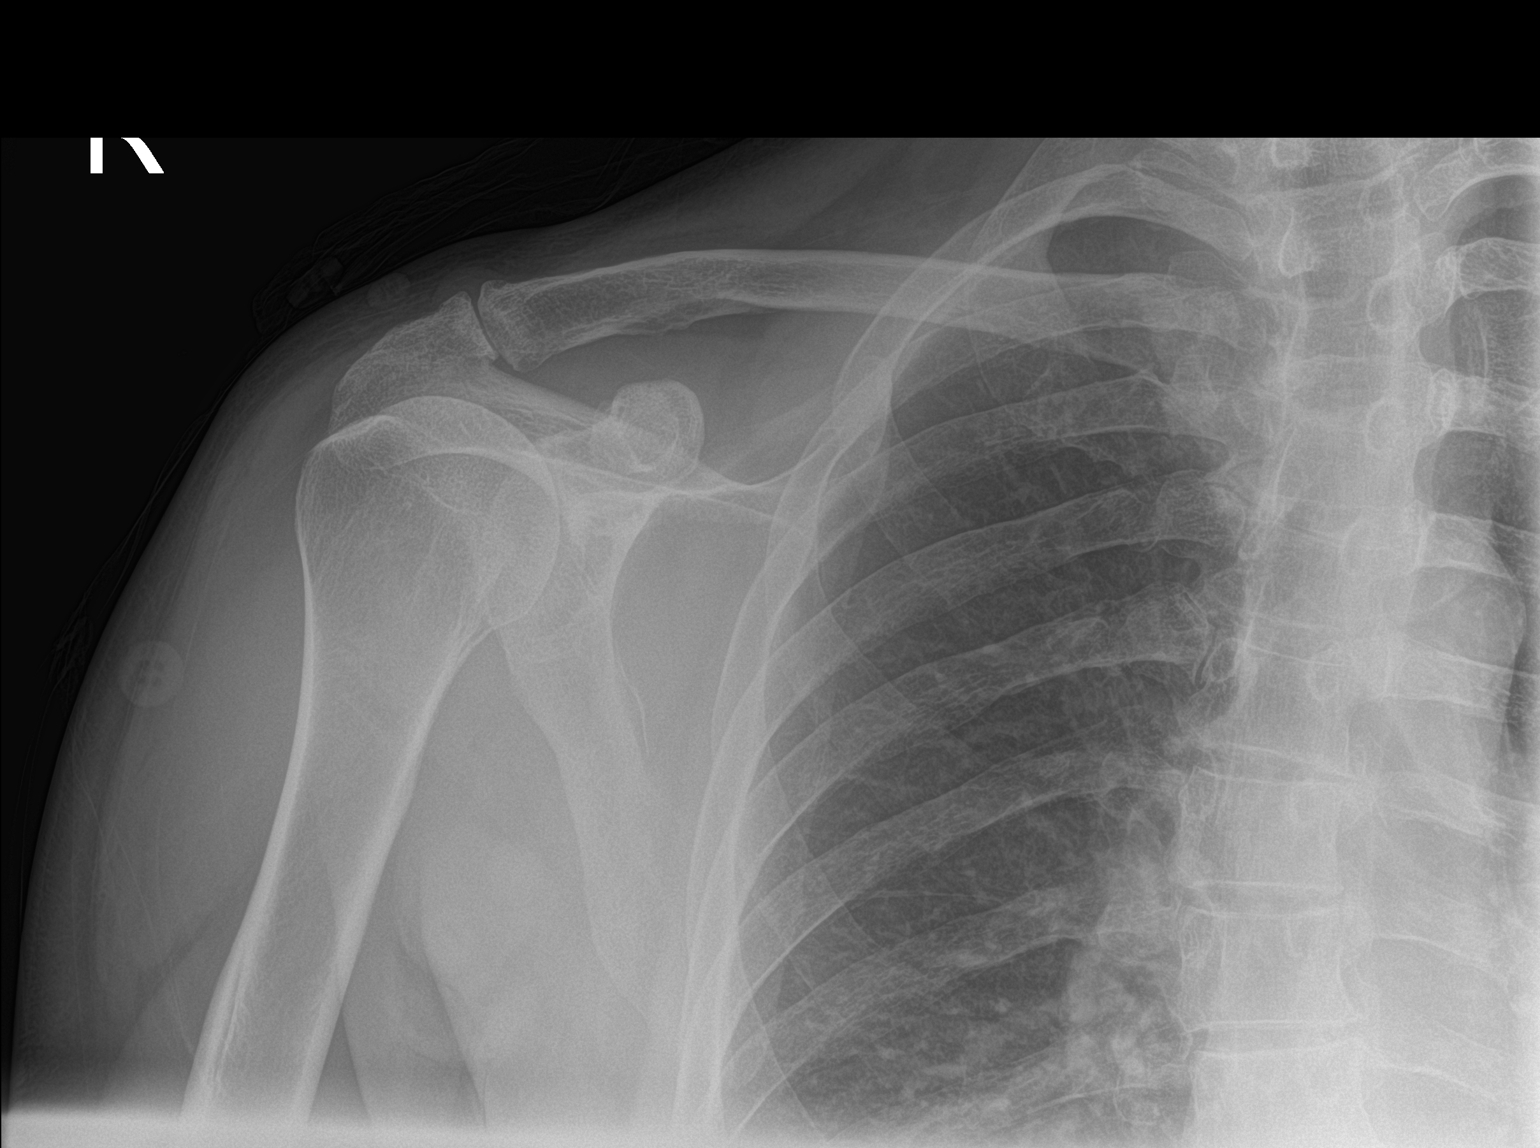
[im 2/3]
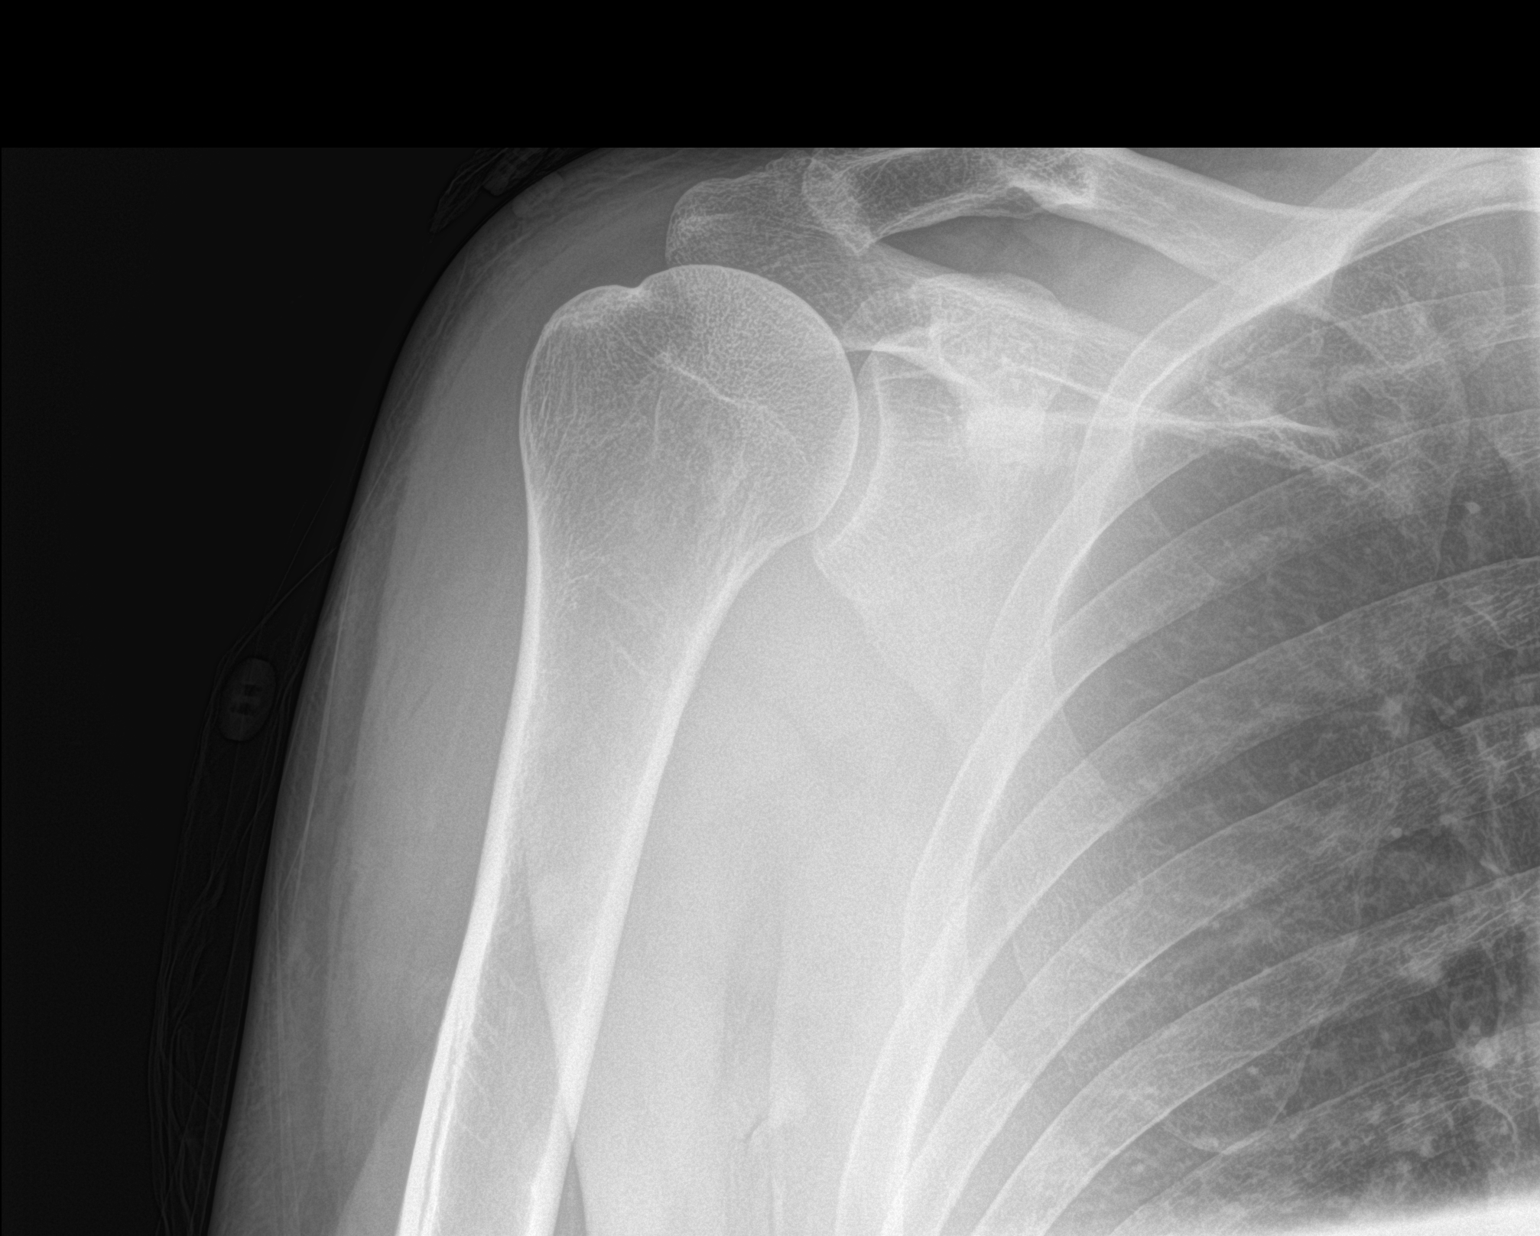
[im 3/3]
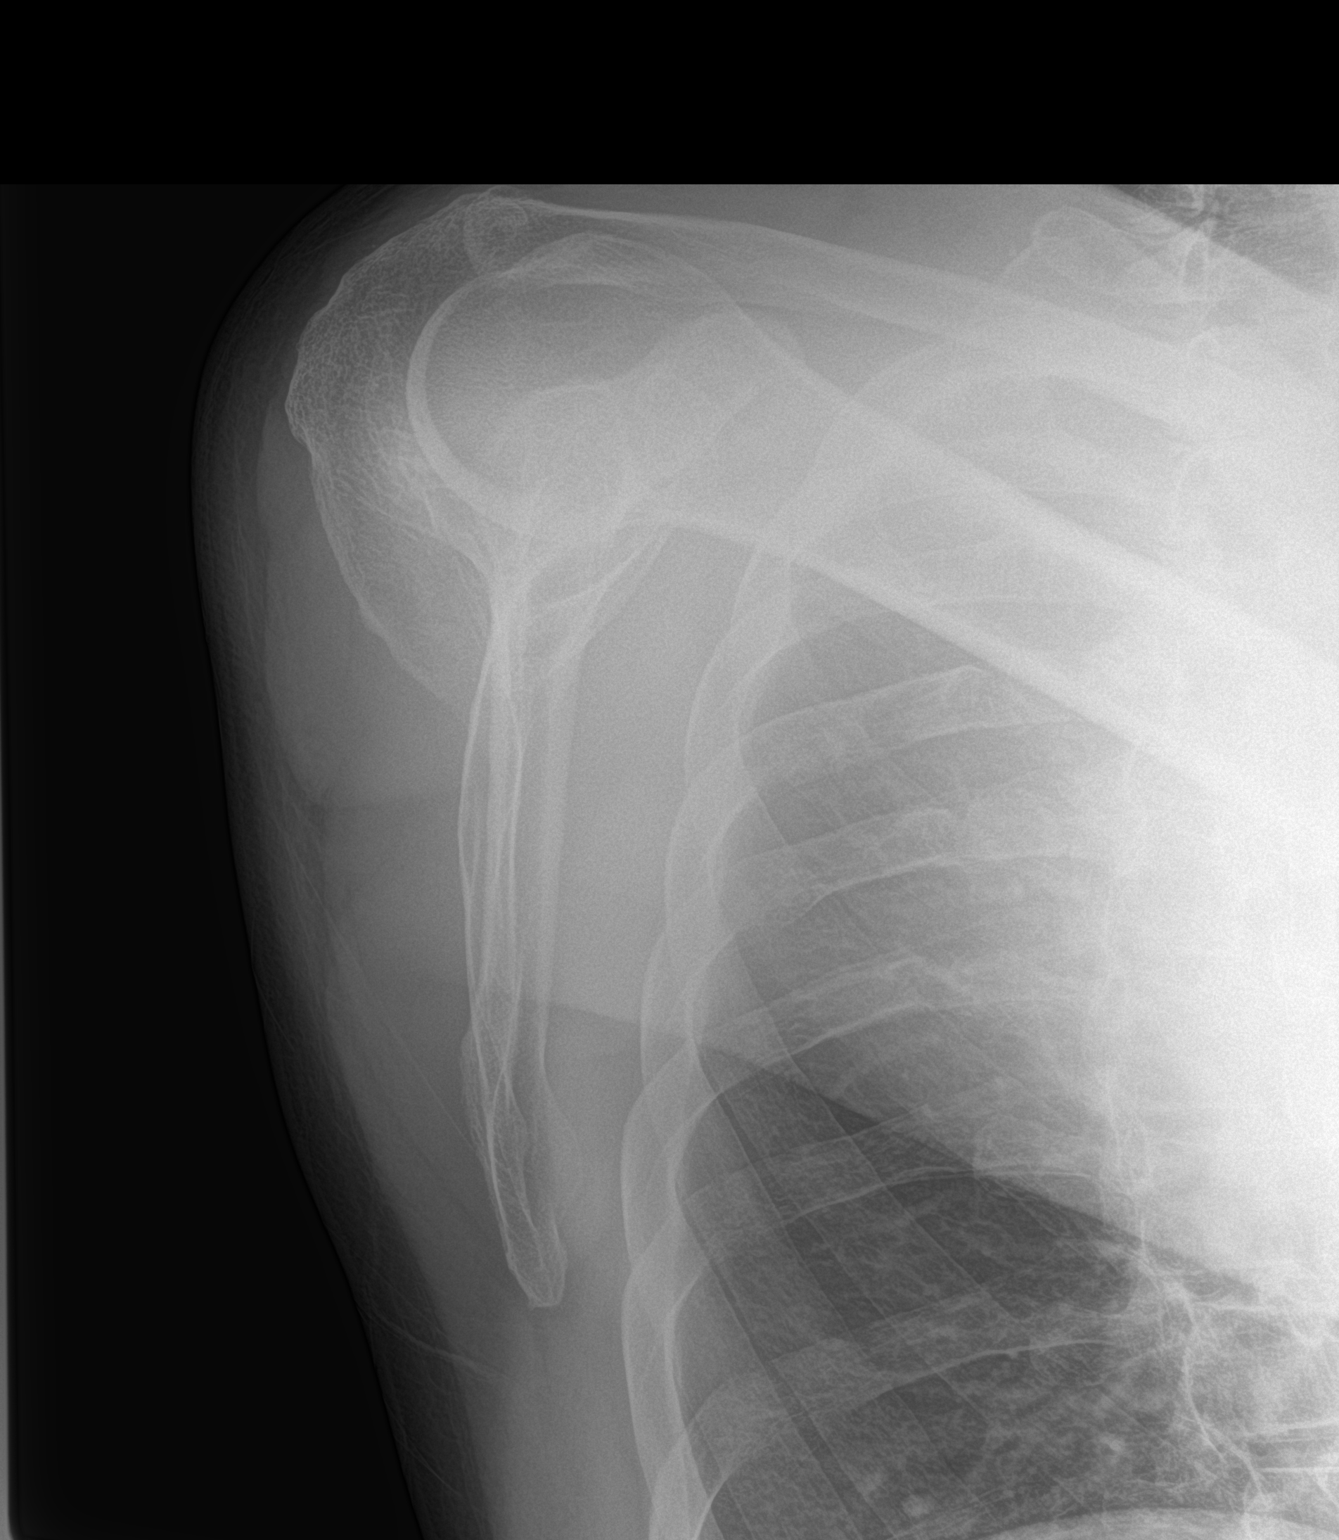

[3 of 3 positions shown; findings below may reference images not displayed]

FINDINGS: Three view radiograph right shoulder demonstrates normal alignment.
No fracture or dislocation. Mild acromioclavicular degenerative
arthritis. Glenohumeral joint space has been preserved. Limited
evaluation of the right apex is unremarkable.
IMPRESSION: No acute finding. Mild acromioclavicular degenerative arthritis.

## 2021-10-23 IMAGING — DX DG CHEST 1V PORT
1 series · 1 of 1 positions shown · non-contrast
Comparison: None.

CLINICAL DATA: Trauma, fall

EXAM:
PORTABLE CHEST 1 VIEW

[chest]
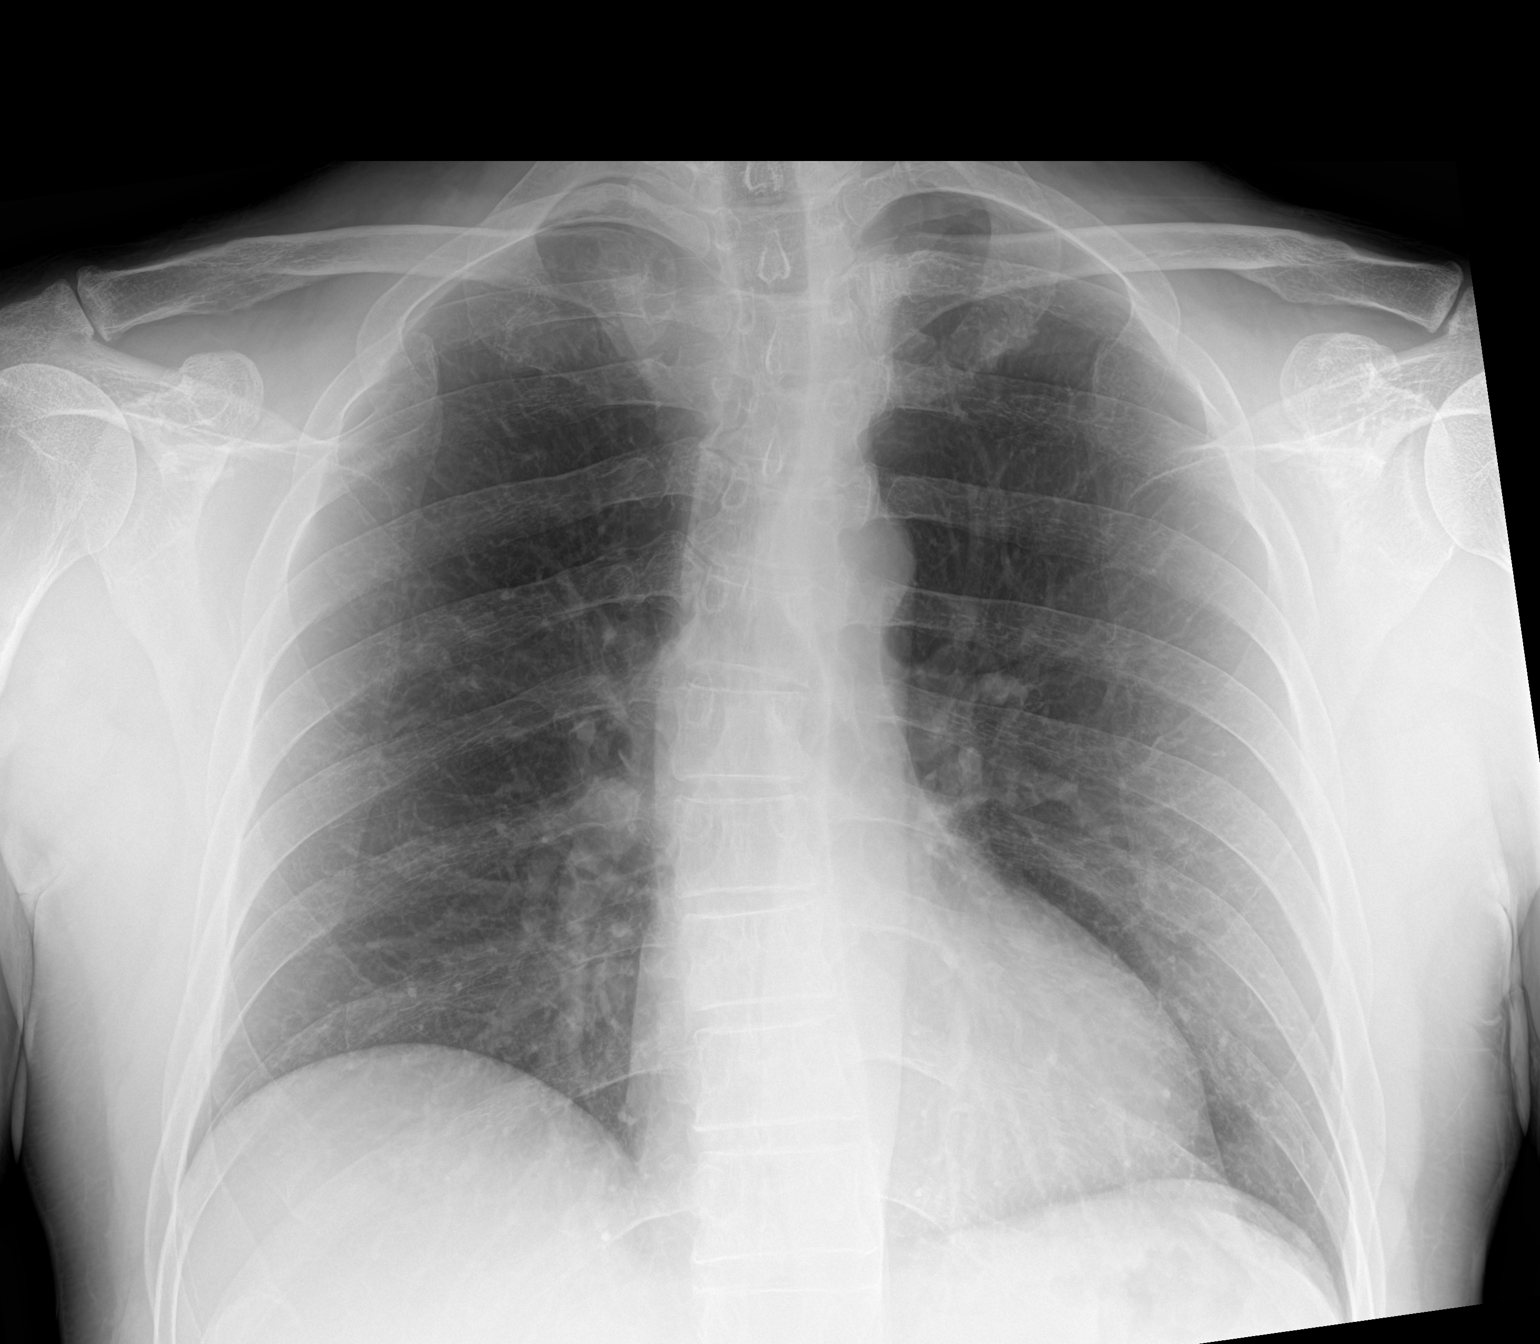

[1 of 1 positions shown; findings below may reference images not displayed]

FINDINGS: The heart size and mediastinal contours are within normal limits.
Both lungs are clear. The visualized skeletal structures are
unremarkable.
IMPRESSION: No active disease.

## 2021-12-14 ENCOUNTER — Other Ambulatory Visit (HOSPITAL_COMMUNITY): Payer: Self-pay

## 2021-12-14 ENCOUNTER — Ambulatory Visit: Payer: BC Managed Care – PPO | Admitting: Family Medicine

## 2021-12-14 ENCOUNTER — Ambulatory Visit (INDEPENDENT_AMBULATORY_CARE_PROVIDER_SITE_OTHER): Payer: BC Managed Care – PPO

## 2021-12-14 ENCOUNTER — Ambulatory Visit: Payer: Self-pay

## 2021-12-14 VITALS — BP 134/78 | HR 78 | Ht 71.0 in | Wt 171.8 lb

## 2021-12-14 DIAGNOSIS — G8929 Other chronic pain: Secondary | ICD-10-CM | POA: Diagnosis not present

## 2021-12-14 DIAGNOSIS — M25511 Pain in right shoulder: Secondary | ICD-10-CM

## 2021-12-14 DIAGNOSIS — M25512 Pain in left shoulder: Secondary | ICD-10-CM | POA: Diagnosis not present

## 2021-12-14 MED ORDER — SILDENAFIL CITRATE 20 MG PO TABS
ORAL_TABLET | ORAL | 3 refills | Status: DC
  Filled 2021-12-14: qty 90, 18d supply, fill #0

## 2021-12-14 NOTE — Patient Instructions (Addendum)
Thank you for coming in today.  ? ?Please get an Xray today before you leave  ? ?I've referred you to Physical Therapy.  Let us know if you don't hear from them in one week.  ? ?Recheck back as needed ?

## 2021-12-14 NOTE — Progress Notes (Signed)
? ?I, [name], LAT, ATC acting as a scribe for Donald Leader, MD. ? ?Subjective:   ? ?CC: Bilat shoulder pain ? ?HPI: Pt is a 61 y/o male c/o bilat shoulder pain x 2-3 months, R>L. Pt notes the shoulder may have been exacerbated doing dolphin poses at yoga. Pt is R-hand dominate. Pt was previously seen by [name] in 2021 for his L shoulder. Pt is a gastroenterologist. Pt locates pain to the superior aspect of bilat shoulders. Pt notes hx of AC DJD.  ? ?Neck pain: no ?Radiates: no ?UE numbness/tingling: no ?UE weakness: no ?Aggravates: should abd, esp over 90, ER  ?Treatments tried: none ? ?Dx imaging: 04/10/20 R shoulder XR & cervical CT ? 02/23/20 L shoulder XR ? 04/11/07 cervical MRI ? ?Pertinent review of Systems: No fevers or chills ? ?Relevant historical information: History of iron deficiency anemia. ? ? ?Objective:   ? ?Vitals:  ? 12/14/21 1502  ?BP: 134/78  ?Pulse: 78  ?SpO2: 97%  ? ?General: Well Developed, well nourished, and in no acute distress.  ? ?MSK: Right shoulder: Normal-appearing ?Minimally tender palpation at lateral superior upper arm. ?Range of motion: Abduction full pain with abduction.  Internal rotation lumbar spine.  External rotation full. ?Strength is intact. ?Positive Hawkins and Neer's test.  Positive empty can test.  Negative Yergason's and speeds test. ? ?Left shoulder: Normal. ?Nontender. ?Normal range of motion. ?Strength is intact. ?Mildly positive Hawkins and Neer's test. ?Negative Yergason's and speeds test. ? ?Lab and Radiology Results ? ?Diagnostic Limited MSK Ultrasound of: Right shoulder ?Biceps tendon intact normal. ?Subscapularis tendon is normal. ?Supraspinatus tendon without visible tear. ?Mild subacromial and subdeltoid bursitis is present. ?Infraspinatus tendon normal. ?AC joint mild effusion with degenerative changes present. ?Impression: Subacromial bursitis ? ? ?X-ray images right shoulder obtained today personally and independently interpreted ?Moderate  AC DJD.  Minimal glenohumeral DJD.  No acute fractures are present. ?Await formal radiology review ? ?Impression and Recommendations:   ? ?Assessment and Plan: ?61 y.o. male with bilateral shoulder pain right worse than left.  Pain thought primarily due to subacromial impingement bursitis and likely rotator cuff tendinopathy. ?This has been ongoing for months but is still chronic and low level.  At this point he would benefit significantly from physical therapy.  Plan to refer to [place] [facility] re[facility] as he already has an established relationship there.  If not improved injection would make sense at some point. ?Anticipate reassessment in 6 weeks if not improved although as needed is reasonable. ? ?PDMP not reviewed this encounter. ?Orders Placed This Encounter  ?Procedures  ? [place] LIMITED JOINT SPACE STRUCTURES UP BILAT(NO LINKED CHARGES)  ?  Order Specific Question:   Reason for Exam (SYMPTOM  OR DIAGNOSIS REQUIRED)  ?  Answer:   bilateral shoulder pain  ?  Order Specific Question:   Preferred imaging location?  ?  Answer:   [place]  ? DG Shoulder Right  ?  Standing Status:   Future  ?  Number of Occurrences:   1  ?  Standing Expiration Date:   12/15/2022  ?  Order Specific Question:   Reason for Exam (SYMPTOM  OR DIAGNOSIS REQUIRED)  ?  Answer:   bialteral shoulder pain  ?  Order Specific Question:   Preferred imaging location?  ?  Answer:   [facility]  ? Ambulatory referral to Physical Therapy  ?  Referral Priority:   Routine  ?  Referral Type:   Physical Medicine  ?  Referral Reason:   Specialty Services Required  ?  Requested Specialty:   Physical Therapy  ?  Number of Visits Requested:   1  ? ?No orders of the defined types were placed in this encounter. ? ? ?Discussed warning signs or symptoms. Please see discharge instructions. Patient expresses understanding. ? ? ?The above documentation has been reviewed and is accurate and complete [name], M.D. ? ?

## 2021-12-19 ENCOUNTER — Encounter: Payer: Self-pay | Admitting: Family Medicine

## 2021-12-19 NOTE — Progress Notes (Signed)
You do have some DJD at the [facility] joint.  Otherwise x-ray looks normal to radiology

## 2021-12-28 ENCOUNTER — Ambulatory Visit: Payer: Self-pay

## 2021-12-28 ENCOUNTER — Ambulatory Visit: Payer: BC Managed Care – PPO | Admitting: Family Medicine

## 2021-12-28 VITALS — BP 130/80 | HR 58 | Ht 71.0 in | Wt 171.0 lb

## 2021-12-28 DIAGNOSIS — G8929 Other chronic pain: Secondary | ICD-10-CM | POA: Diagnosis not present

## 2021-12-28 DIAGNOSIS — M25511 Pain in right shoulder: Secondary | ICD-10-CM

## 2021-12-28 DIAGNOSIS — M25512 Pain in left shoulder: Secondary | ICD-10-CM

## 2021-12-28 NOTE — Progress Notes (Signed)
I, Peterson Lombard, LAT, ATC acting as a scribe for Lynne Leader, MD.  [name] is a 61 y.o. male who presents to [place] at [facility] today for f/u bilat shoulder pain, R>L. Pt is a gastroenterologist. Pt has a hx of AC joint DJD. Pt was last seen by Dr. Georgina Snell on 12/14/21 and was referred to [facility]. Today, pt reports that he would like to discuss a cortisone shot his impingement is bothering him more and PT starts in 1 week   Dx imaging: 12/14/21 R shoulder XR 04/10/20 R shoulder XR & cervical CT             02/23/20 L shoulder XR             04/11/07 Cervical MRI  Pertinent review of systems: No fevers or chills  Relevant historical information: History of kidney stone.   Exam:  BP 130/80   Pulse (!) 58   Ht '5\' 11"'$  (1.803 m)   Wt 171 lb (77.6 kg)   SpO2 90%   BMI 23.85 kg/m  General: Well Developed, well nourished, and in no acute distress.   MSK: Right shoulder: Normal. Normal motion pain with abduction. Left shoulder normal-appearing Normal motion pain with abduction.    Lab and Radiology Results  Procedure: Real-time Ultrasound Guided Injection of right shoulder subacromial bursa Device: Philips Affiniti 50G Images permanently stored and available for review in PACS Verbal informed consent obtained.  Discussed risks and benefits of procedure. Warned about infection, bleeding, hyperglycemia damage to structures among others. Patient expresses understanding and agreement Time-out conducted.   Noted no overlying erythema, induration, or other signs of local infection.   Skin prepped in a sterile fashion.   Local anesthesia: Topical Ethyl chloride.   With sterile technique and under real time ultrasound guidance: 40 mg of Kenalog and 2 mL of Marcaine injected into subacromial bursa. Fluid seen entering the bursa.   Completed without difficulty   Pain moderately  resolved suggesting accurate placement of the medication.   Advised to call  if fevers/chills, erythema, induration, drainage, or persistent bleeding.   Images permanently stored and available for review in the ultrasound unit.  Impression: Technically successful ultrasound guided injection.    Procedure: Real-time Ultrasound Guided Injection of left shoulder subacromial bursa Device: Philips Affiniti 50G Images permanently stored and available for review in PACS Verbal informed consent obtained.  Discussed risks and benefits of procedure. Warned about infection, bleeding, hyperglycemia damage to structures among others. Patient expresses understanding and agreement Time-out conducted.   Noted no overlying erythema, induration, or other signs of local infection.   Skin prepped in a sterile fashion.   Local anesthesia: Topical Ethyl chloride.   With sterile technique and under real time ultrasound guidance: 40 mg of Kenalog and 2 mL of Marcaine injected into the subacromial bursa. Fluid seen entering the bursa.   Completed without difficulty   Pain moderately  resolved suggesting accurate placement of the medication.   Advised to call if fevers/chills, erythema, induration, drainage, or persistent bleeding.   Images permanently stored and available for review in the ultrasound unit.  Impression: Technically successful ultrasound guided injection.         Assessment and Plan: 61 y.o. male with bilateral shoulder pain right worse than left.  Pain thought to be due to impingement and bursitis.  Physical therapy is scheduled to start next week.  Plan for steroid injection today.  Recheck back after physical therapy if  not improving.   PDMP not reviewed this encounter. Orders Placed This Encounter  Procedures   [place] LIMITED JOINT SPACE STRUCTURES UP BILAT(NO LINKED CHARGES)    Order Specific Question:   Reason for Exam (SYMPTOM  OR DIAGNOSIS REQUIRED)    Answer:   BL inj    Order Specific Question:   Preferred imaging location?    Answer:   [place]   No orders of the defined types were placed in this encounter.    Discussed warning signs or symptoms. Please see discharge instructions. Patient expresses understanding.   The above documentation has been reviewed and is accurate and complete Lynne Leader, M.D.

## 2021-12-28 NOTE — Patient Instructions (Addendum)
Good to see you   Call or go to the ER if you develop a large red swollen joint with extreme pain or oozing puss.    Let me know.

## 2021-12-30 ENCOUNTER — Other Ambulatory Visit (HOSPITAL_COMMUNITY): Payer: Self-pay

## 2021-12-30 MED ORDER — OLMESARTAN MEDOXOMIL 40 MG PO TABS
ORAL_TABLET | ORAL | 3 refills | Status: DC
  Filled 2021-12-30: qty 90, 90d supply, fill #0

## 2022-04-12 ENCOUNTER — Encounter: Payer: Self-pay | Admitting: Family Medicine

## 2022-07-14 ENCOUNTER — Other Ambulatory Visit (HOSPITAL_COMMUNITY): Payer: Self-pay

## 2022-07-14 ENCOUNTER — Telehealth: Payer: Self-pay | Admitting: *Deleted

## 2022-07-14 DIAGNOSIS — Z86010 Personal history of colonic polyps: Secondary | ICD-10-CM

## 2022-07-14 MED ORDER — NA SULFATE-K SULFATE-MG SULF 17.5-3.13-1.6 GM/177ML PO SOLN
ORAL | 0 refills | Status: DC
  Filled 2022-07-14: qty 354, 1d supply, fill #0

## 2022-07-14 NOTE — Telephone Encounter (Signed)
Colonoscopy instructions created and given to patient.

## 2022-07-17 ENCOUNTER — Encounter: Payer: Self-pay | Admitting: Internal Medicine

## 2022-07-24 ENCOUNTER — Ambulatory Visit (AMBULATORY_SURGERY_CENTER): Payer: BC Managed Care – PPO | Admitting: Internal Medicine

## 2022-07-24 ENCOUNTER — Encounter: Payer: Self-pay | Admitting: Internal Medicine

## 2022-07-24 VITALS — BP 102/72 | HR 60 | Temp 97.3°F | Resp 12 | Ht 70.25 in | Wt 168.6 lb

## 2022-07-24 DIAGNOSIS — D122 Benign neoplasm of ascending colon: Secondary | ICD-10-CM | POA: Diagnosis not present

## 2022-07-24 DIAGNOSIS — Z09 Encounter for follow-up examination after completed treatment for conditions other than malignant neoplasm: Secondary | ICD-10-CM

## 2022-07-24 DIAGNOSIS — Z86010 Personal history of colonic polyps: Secondary | ICD-10-CM

## 2022-07-24 MED ORDER — SODIUM CHLORIDE 0.9 % IV SOLN: 500.0000 mL | Freq: Once | INTRAVENOUS | Status: DC

## 2022-07-24 NOTE — Progress Notes (Signed)
Called to room to assist during endoscopic procedure.  Patient ID and intended procedure confirmed with present staff. Received instructions for my participation in the procedure from the performing physician.  

## 2022-07-24 NOTE — Progress Notes (Signed)
VS completed by DT.  Pt's states no medical or surgical changes since previsit or office visit.  

## 2022-07-24 NOTE — Progress Notes (Signed)
GASTROENTEROLOGY PROCEDURE H&P NOTE   Primary Care Physician: Ginger Organ., MD    Reason for Procedure:   Hx of small adenoma  Plan:    colonoscopy  Patient is appropriate for endoscopic procedure(s) in the ambulatory ([place]) setting.  The nature of the procedure, as well as the risks, benefits, and alternatives were carefully and thoroughly reviewed with the patient. Ample time for discussion and questions allowed. The patient understood, was satisfied, and agreed to proceed.     HPI: [name] is a 61 y.o. male who presents for surveillance colonoscopy.  Medical history as below.  Tolerated the prep.  No recent chest pain or shortness of breath.  No abdominal pain today.  Past Medical History:  Diagnosis Date   Allergy    SEASONAL   Duodenal ulcer with hemorrhage    X2   Gilbert's syndrome    Hypertension    IBS (irritable bowel syndrome)    Internal hemorrhoids    Nephrolithiasis    Osteoarthritis    Seasonal affective disorder ([place])    Vitamin D deficiency     Past Surgical History:  Procedure Laterality Date   COLONOSCOPY  1998   ESOPHAGOGASTRODUODENOSCOPY  1992   OPEN REDUCTION INTERNAL FIXATION (ORIF) DISTAL RADIAL FRACTURE Left 04/13/2020   Procedure: OPEN REDUCTION INTERNAL FIXATION (ORIF) LEFT DISTAL RADIUS FRACTURE;  Surgeon: Leanora Cover, MD;  Location: [place];  Service: Orthopedics;  Laterality: Left;   OPEN REDUCTION INTERNAL FIXATION (ORIF) METACARPAL Left 04/13/2020   Procedure: OPEN REDUCTION INTERNAL FIXATION (ORIF) LEFT SMALL METACARPAL;  Surgeon: Leanora Cover, MD;  Location: [place];  Service: Orthopedics;  Laterality: Left;   OPEN REDUCTION INTERNAL FIXATION (ORIF) PROXIMAL PHALANX Right 04/13/2020   Procedure: OPEN REDUCTION AND PINNING WITH APPLICATION OF EXTERNAL FIXATOR RIGHT PROXIMAL PHALANX;  Surgeon: Leanora Cover, MD;  Location: [place];  Service: Orthopedics;  Laterality: Right;    TONSILLECTOMY AND ADENOIDECTOMY  1972    Prior to Admission medications   Medication Sig Start Date End Date Taking? Authorizing Provider  fluticasone (FLONASE) 50 MCG/ACT nasal spray Place 2 sprays into both nostrils 2 (two) times daily as needed for allergies or rhinitis.   Yes [provider]  loratadine (CLARITIN) 10 MG tablet Take 10 mg by mouth daily as needed for allergies.    [provider]    Current Outpatient Medications  Medication Sig Dispense Refill   fluticasone (FLONASE) 50 MCG/ACT nasal spray Place 2 sprays into both nostrils 2 (two) times daily as needed for allergies or rhinitis.     loratadine (CLARITIN) 10 MG tablet Take 10 mg by mouth daily as needed for allergies.     Current Facility-Administered Medications  Medication Dose Route Frequency Provider Last Rate Last Admin   0.9 %  sodium chloride infusion  500 mL Intravenous Once Tammela Bales, Lajuan Lines, MD        Allergies as of 07/24/2022   (No Known Allergies)    Family History  Problem Relation Age of Onset   Lung cancer Mother 64   Lymphoma Father 14   Cancer - Other Father 13       AMPULLARY   Thyroid cancer Sister 92   Colon cancer Neg Hx     Social History   Socioeconomic History   Marital status: Married    Spouse name: Not on file   Number of children: Not on file   Years of education: Not on file  Highest education level: Not on file  Occupational History   Not on file  Tobacco Use   Smoking status: Never   Smokeless tobacco: Never  Vaping Use   Vaping Use: Never used  Substance and Sexual Activity   Alcohol use: Yes    Alcohol/week: 5.0 standard drinks of alcohol    Types: 5 drink(s) per week    Comment: 5 drinks a week   Drug use: No   Sexual activity: Not on file  Other Topics Concern   Not on file  Social History Narrative   Not on file   Social Determinants of Health   Financial Resource Strain: Not on file  Food Insecurity: Not on file  Transportation  Needs: Not on file  Physical Activity: Not on file  Stress: Not on file  Social Connections: Not on file  Intimate Partner Violence: Not on file    Physical Exam: Vital signs in last 24 hours: '@BP'$  130/69   Pulse 78   Temp (!) 97.3 F (36.3 C) (Temporal)   Ht 5' 10.25" (1.784 m)   Wt 168 lb 9.6 oz (76.5 kg)   SpO2 98%   BMI 24.02 kg/m  GEN: NAD EYE: Sclerae anicteric ENT: MMM CV: Non-tachycardic Pulm: CTA b/l GI: Soft, NT/ND NEURO:  Alert & Oriented x 3   Zenovia Jarred, MD [facility]  07/24/2022 1:39 PM

## 2022-07-24 NOTE — Patient Instructions (Signed)
Read all of the handouts given to you by your recovery room nurse.  Resume all of your previous medications.  YOU HAD AN ENDOSCOPIC PROCEDURE TODAY AT [facility]:   Refer to the procedure report that was given to you for any specific questions about what was found during the examination.  If the procedure report does not answer your questions, please call your gastroenterologist to clarify.  If you requested that your care partner not be given the details of your procedure findings, then the procedure report has been included in a sealed envelope for you to review at your convenience later.  YOU SHOULD EXPECT: Some feelings of bloating in the abdomen. Passage of more gas than usual.  Walking can help get rid of the air that was put into your GI tract during the procedure and reduce the bloating. If you had a lower endoscopy (such as a colonoscopy or flexible sigmoidoscopy) you may notice spotting of blood in your stool or on the toilet paper. If you underwent a bowel prep for your procedure, you may not have a normal bowel movement for a few days.  Please Note:  You might notice some irritation and congestion in your nose or some drainage.  This is from the oxygen used during your procedure.  There is no need for concern and it should clear up in a day or so.  SYMPTOMS TO REPORT IMMEDIATELY:  Following lower endoscopy (colonoscopy or flexible sigmoidoscopy):  Excessive amounts of blood in the stool  Significant tenderness or worsening of abdominal pains  Swelling of the abdomen that is new, acute  Fever of 100F or higher   For urgent or emergent issues, a gastroenterologist can be reached at any hour by calling [phone]. Do not use MyChart messaging for urgent concerns.    DIET:  We do recommend a small meal at first, but then you may proceed to your regular diet.  Drink plenty of fluids but you should avoid alcoholic beverages for 24 hours.  ACTIVITY:  You should plan  to take it easy for the rest of today and you should NOT DRIVE or use heavy machinery until tomorrow (because of the sedation medicines used during the test).    FOLLOW UP: Our staff will call the number listed on your records the next business day following your procedure.  We will call around 7:15- 8:00 am to check on you and address any questions or concerns that you may have regarding the information given to you following your procedure. If we do not reach you, we will leave a message.     If any biopsies were taken you will be contacted by phone or by letter within the next 1-3 weeks.  Please call us at [phone] if you have not heard about the biopsies in 3 weeks.    SIGNATURES/CONFIDENTIALITY: You and/or your care partner have signed paperwork which will be entered into your electronic medical record.  These signatures attest to the fact that that the information above on your After Visit Summary has been reviewed and is understood.  Full responsibility of the confidentiality of this discharge information lies with you and/or your care-partner.

## 2022-07-24 NOTE — Progress Notes (Signed)
Sedate, gd SR, tolerated procedure well, VSS, report to RN 

## 2022-07-24 NOTE — Op Note (Signed)
[place] Patient Name: [name] Procedure Date: 07/24/2022 1:30 PM MRN: [id number] Endoscopist: Jerene Bears , MD, [id number] Age: 61 Referring MD:  Date of Birth: May 27, 1961 Gender: Male Account #: [id number] Procedure:                Colonoscopy Indications:              High risk colon cancer surveillance: Personal                            history of non-advanced adenoma, Last colonoscopy:                            November 2014 Medicines:                Monitored Anesthesia Care Procedure:                Pre-Anesthesia Assessment:                           - Prior to the procedure, a History and Physical                            was performed, and patient medications and                            allergies were reviewed. The patient's tolerance of                            previous anesthesia was also reviewed. The risks                            and benefits of the procedure and the sedation                            options and risks were discussed with the patient.                            All questions were answered, and informed consent                            was obtained. Prior Anticoagulants: The patient has                            taken no anticoagulant or antiplatelet agents. ASA                            Grade Assessment: II - A patient with mild systemic                            disease. After reviewing the risks and benefits,                            the patient was deemed in satisfactory condition to  undergo the procedure.                           After obtaining informed consent, the colonoscope                            was passed under direct vision. Throughout the                            procedure, the patient's blood pressure, pulse, and                            oxygen saturations were monitored continuously. The                            CF [id number] #[id number] was introduced through the anus                             and advanced to the terminal ileum. The colonoscopy                            was performed without difficulty. The patient                            tolerated the procedure well. The quality of the                            bowel preparation was excellent. The terminal                            ileum, ileocecal valve, appendiceal orifice, and                            rectum were photographed. Scope In: 1:41:49 PM Scope Out: 2:00:09 PM Scope Withdrawal Time: 0 hours 15 minutes 17 seconds  Total Procedure Duration: 0 hours 18 minutes 20 seconds  Findings:                 The digital rectal exam was normal.                           A 3 mm polyp was found in the ascending colon. The                            polyp was sessile. The polyp was removed with a                            cold snare. Resection and retrieval were complete.                           Anal papilla(e) were hypertrophied.                           The exam was otherwise without abnormality on  direct and retroflexion views. Complications:            No immediate complications. Estimated Blood Loss:     Estimated blood loss: none. Impression:               - One 3 mm polyp in the ascending colon, removed                            with a cold snare. Resected and retrieved.                           - The examination was otherwise normal on direct                            and retroflexion views. Recommendation:           - Patient has a contact number available for                            emergencies. The signs and symptoms of potential                            delayed complications were discussed with the                            patient. Return to normal activities tomorrow.                            Written discharge instructions were provided to the                            patient.                           - Resume previous diet.                            - Continue present medications.                           - Await pathology results.                           - Repeat colonoscopy is recommended for                            surveillance. The colonoscopy date will be                            determined after pathology results from today's                            exam become available for review. Jerene Bears, MD 07/24/2022 2:02:49 PM This report has been signed electronically.

## 2022-07-25 ENCOUNTER — Telehealth: Payer: Self-pay

## 2022-07-25 NOTE — Telephone Encounter (Signed)
  Follow up Call-     07/24/2022   12:48 PM  Call back number  Post procedure Call Back phone  # [phone]  Permission to leave phone message Yes     Patient questions:  Do you have a fever, pain , or abdominal swelling? No. Pain Score  0 *  Have you tolerated food without any problems? Yes.    Have you been able to return to your normal activities? Yes.    Do you have any questions about your discharge instructions: Diet   No. Medications  No. Follow up visit  No.  Do you have questions or concerns about your Care? No.  Actions: * If pain score is 4 or above: No action needed, pain <4.  RN spoke to Dr. Carlean Purl in hallway and he stated he was doing fine. SChaplin, RN,BSN

## 2022-07-27 ENCOUNTER — Encounter: Payer: Self-pay | Admitting: Internal Medicine

## 2023-04-27 ENCOUNTER — Other Ambulatory Visit (INDEPENDENT_AMBULATORY_CARE_PROVIDER_SITE_OTHER): Payer: BC Managed Care – PPO

## 2023-04-27 ENCOUNTER — Other Ambulatory Visit: Payer: Self-pay | Admitting: *Deleted

## 2023-04-27 DIAGNOSIS — R3989 Other symptoms and signs involving the genitourinary system: Secondary | ICD-10-CM

## 2023-04-27 DIAGNOSIS — R319 Hematuria, unspecified: Secondary | ICD-10-CM

## 2023-04-27 LAB — CBC WITH DIFFERENTIAL/PLATELET
Basophils Absolute: 0.1 10*3/uL (ref 0.0–0.1)
Basophils Relative: 1.1 % (ref 0.0–3.0)
Eosinophils Absolute: 0.1 10*3/uL (ref 0.0–0.7)
Eosinophils Relative: 1.6 % (ref 0.0–5.0)
HCT: 45 % (ref 39.0–52.0)
Hemoglobin: 15 g/dL (ref 13.0–17.0)
Lymphocytes Relative: 17.7 % (ref 12.0–46.0)
Lymphs Abs: 1.5 10*3/uL (ref 0.7–4.0)
MCHC: 33.4 g/dL (ref 30.0–36.0)
MCV: 90.9 fl (ref 78.0–100.0)
Monocytes Absolute: 0.6 10*3/uL (ref 0.1–1.0)
Monocytes Relative: 6.7 % (ref 3.0–12.0)
Neutro Abs: 6.2 10*3/uL (ref 1.4–7.7)
Neutrophils Relative %: 72.9 % (ref 43.0–77.0)
Platelets: 204 10*3/uL (ref 150.0–400.0)
RBC: 4.95 Mil/uL (ref 4.22–5.81)
RDW: 13.8 % (ref 11.5–15.5)
WBC: 8.5 10*3/uL (ref 4.0–10.5)

## 2023-04-27 LAB — BASIC METABOLIC PANEL
BUN: 24 mg/dL — ABNORMAL HIGH (ref 6–23)
CO2: 25 mEq/L (ref 19–32)
Calcium: 9.3 mg/dL (ref 8.4–10.5)
Chloride: 102 mEq/L (ref 96–112)
Creatinine, Ser: 0.97 mg/dL (ref 0.40–1.50)
GFR: 84.98 mL/min (ref >60.00)
Glucose, Bld: 99 mg/dL (ref 70–99)
Potassium: 4.2 mEq/L (ref 3.5–5.1)
Sodium: 137 mEq/L (ref 135–145)

## 2023-04-27 LAB — URINALYSIS, ROUTINE W REFLEX MICROSCOPIC
Ketones, ur: NEGATIVE
Leukocytes,Ua: NEGATIVE
Nitrite: NEGATIVE
Specific Gravity, Urine: 1.01 (ref 1.000–1.030)
Total Protein, Urine: 100 — AB
Urine Glucose: NEGATIVE
Urobilinogen, UA: 0.2 (ref 0.0–1.0)
pH: 6.5 (ref 5.0–8.0)

## 2023-04-27 NOTE — Progress Notes (Signed)
Pt has complaint of abnormal urine color. Per Dr Rhea Belton, order urinalysis. Orders entered in EPIC.

## 2023-04-27 NOTE — Progress Notes (Signed)
BMET and CBC also ordered as per verbal orders given.

## 2023-04-27 NOTE — Addendum Note (Signed)
Addended by: Richardson Chiquito on: 04/27/2023 02:32 PM   Modules accepted: Orders

## 2023-05-01 ENCOUNTER — Other Ambulatory Visit: Payer: Self-pay | Admitting: Urology

## 2023-05-01 DIAGNOSIS — N200 Calculus of kidney: Secondary | ICD-10-CM

## 2023-05-02 ENCOUNTER — Other Ambulatory Visit: Payer: Self-pay | Admitting: Internal Medicine

## 2023-05-02 ENCOUNTER — Other Ambulatory Visit (INDEPENDENT_AMBULATORY_CARE_PROVIDER_SITE_OTHER): Payer: BC Managed Care – PPO

## 2023-05-02 DIAGNOSIS — R319 Hematuria, unspecified: Secondary | ICD-10-CM

## 2023-05-02 LAB — URINALYSIS, ROUTINE W REFLEX MICROSCOPIC
Bilirubin Urine: NEGATIVE
Hgb urine dipstick: NEGATIVE
Leukocytes,Ua: NEGATIVE
Nitrite: NEGATIVE
RBC / HPF: NONE SEEN (ref 0–?)
Specific Gravity, Urine: <=1.005 — AB (ref 1.000–1.030)
Total Protein, Urine: NEGATIVE
Urine Glucose: NEGATIVE
Urobilinogen, UA: 0.2 (ref 0.0–1.0)
WBC, UA: NONE SEEN (ref 0–?)
pH: 6 (ref 5.0–8.0)

## 2023-05-03 ENCOUNTER — Ambulatory Visit (HOSPITAL_COMMUNITY)
Admission: RE | Admit: 2023-05-03 | Discharge: 2023-05-03 | Disposition: A | Discharge status: Home / Self Care | Payer: BC Managed Care – PPO | Source: Ambulatory Visit | Attending: Urology | Admitting: Urology

## 2023-05-03 DIAGNOSIS — N200 Calculus of kidney: Secondary | ICD-10-CM | POA: Diagnosis present

## 2023-05-14 ENCOUNTER — Other Ambulatory Visit: Payer: Self-pay | Admitting: Urology

## 2023-05-17 NOTE — Progress Notes (Signed)
Left message for patient to return call for instructions on Litho

## 2023-05-18 ENCOUNTER — Encounter (HOSPITAL_BASED_OUTPATIENT_CLINIC_OR_DEPARTMENT_OTHER): Payer: Self-pay | Admitting: Urology

## 2023-05-18 NOTE — Progress Notes (Signed)
05/18/2023 11:15 AM Pre procedure call completed. Pt. Updated on date and time of arrival 05/21/23 at 0600. Times for NPO and clear liquid consumption until MN reviewed. Pt. Allergies, medical hx and medication list reviewed. Medications ok to take day of surgery (none) and those to hold prior to surgery reviewed. Ride secured for day of surgery( pt. Wife). Questions and concerns addressed by patient. Pt. Verbalized understanding of all instructions.   [name], Blanchard Kelch

## 2023-05-21 ENCOUNTER — Other Ambulatory Visit: Payer: Self-pay

## 2023-05-21 ENCOUNTER — Encounter (HOSPITAL_BASED_OUTPATIENT_CLINIC_OR_DEPARTMENT_OTHER): Payer: Self-pay | Admitting: Urology

## 2023-05-21 ENCOUNTER — Other Ambulatory Visit (HOSPITAL_COMMUNITY): Payer: Self-pay

## 2023-05-21 ENCOUNTER — Ambulatory Visit (HOSPITAL_COMMUNITY): Payer: BC Managed Care – PPO

## 2023-05-21 ENCOUNTER — Encounter (HOSPITAL_BASED_OUTPATIENT_CLINIC_OR_DEPARTMENT_OTHER)
Admission: RE | Disposition: A | Discharge status: Home / Self Care | Payer: Self-pay | Source: Home / Self Care | Attending: Urology

## 2023-05-21 ENCOUNTER — Ambulatory Visit (HOSPITAL_BASED_OUTPATIENT_CLINIC_OR_DEPARTMENT_OTHER)
Admission: RE | Admit: 2023-05-21 | Discharge: 2023-05-21 | Disposition: A | Discharge status: Home / Self Care | Payer: BC Managed Care – PPO | Attending: Urology | Admitting: Urology

## 2023-05-21 DIAGNOSIS — N200 Calculus of kidney: Secondary | ICD-10-CM | POA: Insufficient documentation

## 2023-05-21 DIAGNOSIS — I10 Essential (primary) hypertension: Secondary | ICD-10-CM | POA: Insufficient documentation

## 2023-05-21 HISTORY — PX: EXTRACORPOREAL SHOCK WAVE LITHOTRIPSY: SHX1557

## 2023-05-21 HISTORY — DX: Personal history of urinary calculi: Z87.442

## 2023-05-21 SURGERY — LITHOTRIPSY, ESWL
Anesthesia: LOCAL | Laterality: Left

## 2023-05-21 MED ORDER — DIAZEPAM 5 MG PO TABS
ORAL_TABLET | ORAL | Status: AC
  Filled 2023-05-21: qty 2

## 2023-05-21 MED ORDER — ONDANSETRON 4 MG PO TBDP
4.0000 mg | ORAL_TABLET | Freq: Three times a day (TID) | ORAL | 0 refills | Status: DC | PRN
  Filled 2023-05-21: qty 12, 4d supply, fill #0

## 2023-05-21 MED ORDER — HYDROCODONE-ACETAMINOPHEN 7.5-325 MG PO TABS
1.0000 | ORAL_TABLET | Freq: Four times a day (QID) | ORAL | 0 refills | Status: DC | PRN
  Filled 2023-05-21: qty 10, 3d supply, fill #0

## 2023-05-21 MED ORDER — SODIUM CHLORIDE 0.9 % IV SOLN: INTRAVENOUS | Status: DC

## 2023-05-21 MED ORDER — CIPROFLOXACIN HCL 500 MG PO TABS
ORAL_TABLET | ORAL | Status: AC
  Filled 2023-05-21: qty 1

## 2023-05-21 MED ORDER — CIPROFLOXACIN HCL 500 MG PO TABS
500.0000 mg | ORAL_TABLET | ORAL | Status: AC
  Administered 2023-05-21: 500 mg via ORAL

## 2023-05-21 MED ORDER — DIPHENHYDRAMINE HCL 25 MG PO CAPS
ORAL_CAPSULE | ORAL | Status: AC
  Filled 2023-05-21: qty 1

## 2023-05-21 MED ORDER — DIAZEPAM 5 MG PO TABS
10.0000 mg | ORAL_TABLET | ORAL | Status: AC
  Administered 2023-05-21: 10 mg via ORAL

## 2023-05-21 MED ORDER — DIPHENHYDRAMINE HCL 25 MG PO CAPS
25.0000 mg | ORAL_CAPSULE | ORAL | Status: AC
  Administered 2023-05-21: 25 mg via ORAL

## 2023-05-21 NOTE — H&P (Signed)
H&P  Chief Complaint: Kidney stone  History of Present Illness: [name] is a 62 year old male with a history of stones.  He had painless gross hematuria and CT scan revealed a 9 mm left lower pole stone.  His hematuria quickly cleared.  A follow-up UA was normal.  CBC and BMP were normal.  He underwent KUB where the stone was visible on the left.  We discussed options in detail and he elected for left shockwave lithotripsy.  He has been well without dysuria, gross hematuria, fever.  No cough cold or congestion.  Past Medical History:  Diagnosis Date   Allergy    SEASONAL   Duodenal ulcer with hemorrhage    X2   Gilbert's syndrome    History of kidney stones    Hypertension    no meds   IBS (irritable bowel syndrome)    Internal hemorrhoids    Nephrolithiasis    Osteoarthritis    Seasonal affective disorder (H[facility])    Vitamin D deficiency    Past Surgical History:  Procedure Laterality Date   COLONOSCOPY  1998   ESOPHAGOGASTRODUODENOSCOPY  1992   OPEN REDUCTION INTERNAL FIXATION (ORIF) DISTAL RADIAL FRACTURE Left 04/13/2020   Procedure: OPEN REDUCTION INTERNAL FIXATION (ORIF) LEFT DISTAL RADIUS FRACTURE;  Surgeon: Betha Loa, MD;  Location: [facility];  Service: Orthopedics;  Laterality: Left;   OPEN REDUCTION INTERNAL FIXATION (ORIF) METACARPAL Left 04/13/2020   Procedure: OPEN REDUCTION INTERNAL FIXATION (ORIF) LEFT SMALL METACARPAL;  Surgeon: Betha Loa, MD;  Location: [facility];  Service: Orthopedics;  Laterality: Left;   OPEN REDUCTION INTERNAL FIXATION (ORIF) PROXIMAL PHALANX Right 04/13/2020   Procedure: OPEN REDUCTION AND PINNING WITH APPLICATION OF EXTERNAL FIXATOR RIGHT PROXIMAL PHALANX;  Surgeon: Betha Loa, MD;  Location:  SUR[facility]ERY CENTER;  Service: Orthopedics;  Laterality: Right;   TONSILLECTOMY AND ADENOIDECTOMY  1972    Home Medications:  Medications Prior to Admission  Medication Sig Dispense Refill Last Dose   fluticasone  (FLONASE) 50 MCG/ACT nasal spray Place 2 sprays into both nostrils 2 (two) times daily as needed for allergies or rhinitis.   Past Month   loratadine (CLARITIN) 10 MG tablet Take 10 mg by mouth daily as needed for allergies.   More than a month   Allergies: No Known Allergies  Family History  Problem Relation Age of Onset   Lung cancer Mother 29   Lymphoma Father 58   Cancer - Other Father 23       AMPULLARY   Thyroid cancer Sister 63   Colon cancer Neg Hx    Social History:  reports that he has never smoked. He has never used smokeless tobacco. He reports current alcohol use of about 5.0 standard drinks of alcohol per week. He reports that he does not use drugs.  ROS: A complete review of systems was performed.  All systems are negative except for pertinent findings as noted. Review of Systems  All other systems reviewed and are negative.    Physical Exam:  Vital signs in last 24 hours: Temp:  [97.8 F (36.6 C)] 97.8 F (36.6 C) (10/14 0622) Pulse Rate:  [57] 57 (10/14 0622) Resp:  [17] 17 (10/14 0622) BP: (128)/(92) 128/92 (10/14 0622) SpO2:  [98 %] 98 % (10/14 0622) Weight:  [75.8 kg] 75.8 kg (10/14 0622) General:  Alert and oriented, No acute distress HEENT: Normocephalic, atraumatic Cardiovascular: Regular rate and rhythm Lungs: Regular rate and effort Abdomen: Soft, nontender, nondistended, no abdominal masses Back:  No CVA tenderness Extremities: No edema Neurologic: Grossly intact  Laboratory Data:  No results found for this or any previous visit (from the past 24 hour(s)). No results found for this or any previous visit (from the past 240 hour(s)). Creatinine: No results for input(s): "CREATININE" in the last 168 hours.  Impression/Assessment:  Renal stone-painless gross hematuria-  Plan:  I discussed with the patient the nature, potential benefits, risks and alternatives to left ESWL, including side effects of the proposed treatment, the likelihood of the  patient achieving the goals of the procedure, and any potential problems that might occur during the procedure or recuperation. All questions answered. Patient elects to proceed.   [name] 05/21/2023

## 2023-05-21 NOTE — Op Note (Addendum)
9 mm left renal stone  LEFT ESWL  Findings: stone fragmented well. He may need a staged procedure if he fails to pass the stone or stone fragments.

## 2023-05-22 ENCOUNTER — Encounter (HOSPITAL_BASED_OUTPATIENT_CLINIC_OR_DEPARTMENT_OTHER): Payer: Self-pay | Admitting: Urology

## 2023-06-13 ENCOUNTER — Other Ambulatory Visit (HOSPITAL_COMMUNITY): Payer: Self-pay

## 2023-10-31 ENCOUNTER — Other Ambulatory Visit (HOSPITAL_COMMUNITY): Payer: Self-pay

## 2023-10-31 MED ORDER — DULOXETINE HCL 30 MG PO CPEP
30.0000 mg | ORAL_CAPSULE | Freq: Every day | ORAL | 0 refills | Status: DC
  Filled 2023-10-31: qty 7, 7d supply, fill #0

## 2023-10-31 MED ORDER — DULOXETINE HCL 60 MG PO CPEP
60.0000 mg | ORAL_CAPSULE | Freq: Every day | ORAL | 11 refills | Status: DC
  Filled 2023-10-31: qty 30, 30d supply, fill #0

## 2023-11-01 ENCOUNTER — Other Ambulatory Visit (HOSPITAL_COMMUNITY): Payer: Self-pay

## 2023-11-01 MED ORDER — MUPIROCIN 2 % EX OINT
TOPICAL_OINTMENT | CUTANEOUS | 0 refills | Status: DC
  Filled 2023-11-01: qty 22, 7d supply, fill #0

## 2023-12-25 ENCOUNTER — Ambulatory Visit (INDEPENDENT_AMBULATORY_CARE_PROVIDER_SITE_OTHER): Admitting: Psychology

## 2023-12-25 DIAGNOSIS — F3289 Other specified depressive episodes: Secondary | ICD-10-CM | POA: Diagnosis not present

## 2023-12-25 NOTE — Progress Notes (Signed)
 [facility] Counselor Initial Adult Exam  Name: [name] Date: 12/25/2023 MRN: [id number] DOB: [birth year] PCP: Jeannine Milroy., MD  Time Spent: 8:00  am - 8:55 am : 55 Minutes  Guardian/Payee:  self    Paperwork requested: No   Reason for Visit /Presenting Problem: depression.   Mental Status Exam: Appearance:   Neat and Well Groomed     Behavior:  Appropriate  Motor:  Normal  Speech/Language:   Clear and Coherent  Affect:  Congruent  Mood:  dysthymic  Thought process:  normal  Thought content:    WNL  Sensory/Perceptual disturbances:    WNL  Orientation:  oriented to person, place, time/date, and situation  Attention:  Good  Concentration:  Good  Memory:  WNL  Fund of knowledge:   Good  Insight:    Good  Judgment:   Good  Impulse Control:  Good   Reported Symptoms:  depression and anxiety.   Risk Assessment: Danger to Self:  No Self-injurious Behavior: No Danger to Others: No Duty to Warn:no Physical Aggression / Violence:No  Access to Firearms a concern: No  Gang Involvement:No  Patient / guardian was educated about steps to take if suicide or homicide risk level increases between visits: no While future psychiatric events cannot be accurately predicted, the patient does not currently require acute inpatient psychiatric care and does not currently meet [place]  involuntary commitment criteria.  Substance Abuse History: Current substance abuse: No     Caffeine: 4.5x coffee per day (latest mid-afternoon).  Tobacco: denied.  Alcohol : 6 drinks a week. Was previously 10-12 drinks a week last year.   Substance use: denied.  Past Psychiatric History:   Previous psychological history is significant for depression Outpatient Providers: Denied.  History of Psych Hospitalization: No  Psychological Testing: NA   [name]'s son has a history of depression. Daughter has a history of ADHD. Father had a history of depression and anxiety.  One  of brother's has been depressed.   Abuse History:  Victim of: No., na   Report needed: No. Victim of Neglect:No. Perpetrator of na  Witness / Exposure to Domestic Violence: No   Protective Services Involvement: No  Witness to [employer] Violence:  No   Family History:  Family History  Problem Relation Age of Onset   Lung cancer Mother 46   Lymphoma Father 45   Cancer - Other Father 72       AMPULLARY   Thyroid cancer Sister 33   Colon cancer Neg Hx     Living situation: the patient lives alone  Sexual Orientation: Straight  Relationship Status: married  Name of spouse / other:  Debbie (61) If a parent, number of children / ages: [name].   Support Systems: spouse Older brother (in [place]).   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   Religion/Sprituality/World View: Catholic  Any cultural differences that may affect / interfere with treatment:  not applicable   Recreation/Hobbies: Yard work, cycling, work enjoyment, hiking, back backing, traveling, cooking, reading, watching TV, pickle ball.     Stressors: Other: work stressors, children needing additional support (retirement), work schedule for wife (social life, tasks around the home), work stressors (satisfaction, work/home boundaries, not working in the way he would like), mood.     Strengths: Supportive Relationships, Family, Hopefulness, Self Advocate, Able to Communicate Effectively, and Other  Barriers:  mood.  Legal History: Pending legal issue / charges: The patient has no significant history of legal issues. History of legal issue / charges: NA  Medical History/Surgical History: reviewed Past Medical History:  Diagnosis Date   Allergy    SEASONAL   Duodenal ulcer with hemorrhage    X2   Gilbert's syndrome    History of kidney stones    Hypertension    no meds   IBS  (irritable bowel syndrome)    Internal hemorrhoids    Nephrolithiasis    Osteoarthritis    Seasonal affective disorder (HCC)    Vitamin D deficiency     Past Surgical History:  Procedure Laterality Date   COLONOSCOPY  1998   ESOPHAGOGASTRODUODENOSCOPY  1992   EXTRACORPOREAL SHOCK WAVE LITHOTRIPSY Left 05/21/2023   Procedure: LEFT EXTRACORPOREAL SHOCK WAVE LITHOTRIPSY (ESWL);  Surgeon: Christina Coyer, MD;  Location: [facility];  Service: Urology;  Laterality: Left;   OPEN REDUCTION INTERNAL FIXATION (ORIF) DISTAL RADIAL FRACTURE Left 04/13/2020   Procedure: OPEN REDUCTION INTERNAL FIXATION (ORIF) LEFT DISTAL RADIUS FRACTURE;  Surgeon: Brunilda Capra, MD;  Location: [facility];  Service: Orthopedics;  Laterality: Left;   OPEN REDUCTION INTERNAL FIXATION (ORIF) METACARPAL Left 04/13/2020   Procedure: OPEN REDUCTION INTERNAL FIXATION (ORIF) LEFT SMALL METACARPAL;  Surgeon: Brunilda Capra, MD;  Location: [facility];  Service: Orthopedics;  Laterality: Left;   OPEN REDUCTION INTERNAL FIXATION (ORIF) PROXIMAL PHALANX Right 04/13/2020   Procedure: OPEN REDUCTION AND PINNING WITH APPLICATION OF EXTERNAL FIXATOR RIGHT PROXIMAL PHALANX;  Surgeon: Brunilda Capra, MD;  Location: [facility];  Service: Orthopedics;  Laterality: Right;   TONSILLECTOMY AND ADENOIDECTOMY  1972    Medications: Current Outpatient Medications  Medication Sig Dispense Refill   DULoxetine  (CYMBALTA ) 30 MG capsule Take 1 capsule (30 mg total) by mouth daily with supper for 7 days 7 capsule 0   DULoxetine  (CYMBALTA ) 60 MG capsule Take 1 capsule (60 mg total) by mouth daily. 30 capsule 11   fluticasone (FLONASE) 50 MCG/ACT nasal spray Place 2 sprays into both nostrils 2 (two) times daily as needed for allergies or rhinitis.     HYDROcodone -acetaminophen  (NORCO) 7.5-325 MG tablet Take 1 tablet by mouth every 6 (six) hours as needed for moderate pain. 10 tablet 0   loratadine  (CLARITIN) 10 MG tablet Take 10 mg by mouth daily as needed for allergies.     mupirocin  ointment (BACTROBAN ) 2 % Apply  a small amount to affected area once a day 22 g 0   ondansetron  (ZOFRAN -ODT) 4 MG disintegrating tablet Take 1 tablet (4 mg total) by mouth every 8 (eight) hours as needed for nausea or vomiting. 12 tablet 0   No current facility-administered medications for this visit.    No Known Allergies  Diagnoses:  Other depression  Psychiatric Treatment: No , but open to this.   Plan of Care: Opt and ?med management with PCP or psychiatry.   Narrative:  Kenney Peacemaker participated from office with therapist and consented to treatment. We reviewed the limits of confidentiality prior to the start of the evaluation. LINWOOD GULLIKSON expressed understanding and agreement to proceed. Abdulah was referred by Dr. Gutterman for depression and anxiety. Valentine noted a history of seasonal affective disorder with some bouts of depression mixed with anxiety. He noted that this past winter was more severe than previously. He does not have a history of counseling but has a history of taking duloxetine  in the past on two  occasions. When he was first prescribed the medication, he noted this being quite effective and was eventually titrated off of the medication. Most recently, he noted experiencing side-effects including increased anxiety and did not improve his depressive symptoms in a meaningful way. He discontinued the medication and is not currently on any medication for mood. 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 He is married to a Publishing rights manager who works at [employer] in the student health center. He described his marriage has "excellent" and that his wife is "wonderfully supportive". His wife's recent transition to this new position has resulted in a shift in her working hours. He noted that this has affected their ability to complete tasks and align schedules, at times. He noted  experiencing various stressors including work related stressors, worry about his adult children, his own mood and declining interest in previously enjoyable activities. Hen noted that this most recent mood episode has been more difficulty to manage and more severe.  He noted his work dissatisfaction being the result of increasing demand, at times difficulty balancing home and work life, delays and being able to see patients due to availability, and bureaucracy.  He discussed the history of  high levels Of career satisfaction but noted most recently being largely dissatisfied.  He noted that his career is very important to him.  Additionally, he noted worry about his adult children and whether he will need to continue to provide support for them due to higher cost of living and additional needs.  He noted that his children are largely self-sufficient and that this worries categorized as more of a "what if".  He wonders how this might affect his retirement as well.  Additionally, he noted worry about what retirement would look like.  He noted often ruminating regarding areas of no control.  He noted often experiencing loss of interest with the exception of completing the necessary tasks.  He has had difficulty maintaining a schedule/ routine outside of work.  There is a family history of ADHD with his daughter and he noted often wondering if he might have some symptoms as well.  Therapist will provide Lydon with that ASRS V1.1, via email, to be completed prior to our follow-up.  He noted history of waking to urinate and having difficulty going to sleep due to rumination.  He previously was taking an over-the-counter sleep aid but has discontinued this due to some improvement in his sleep.  He noted feeling a sense of uncertainty about finances and retirement.  He stated that "its certainly possible that I am overly critical of how I have done things". He wonders if how "they approached parenting" affected his children  negatively although there does not appear to be any evidence for this.  He noted significant stressors occurring after her move and 2022 and discussed that he ans his wife discovered that the home requires various repairs. Depressive symptoms include loss of interest feeling down, middle insomnia, lethargy, feeling bad about self, difficulty concentrating.  Anxiety symptoms feeling anxious, difficulty managing worry, worrying about different things trouble relaxing irritability, and feeling afraid something awful might happen.  Phelan would greatly benefit from counseling to process past events, bolster coping skills, develop a daily routine, manage symptoms including anxiety and rumination, and identify areas of value and meaning in his day-to-day life.  He denied any family history of suicidal ideation or any history for himself as well. A follow-up was scheduled to create a treatment plan and begin  treatment. Therapist answered  and all questions during the evaluation and contact information was provided.   GAD-7: 8 PHQ-9: 8   Belva Boyden, LCSW

## 2024-01-18 ENCOUNTER — Ambulatory Visit (INDEPENDENT_AMBULATORY_CARE_PROVIDER_SITE_OTHER): Admitting: Psychology

## 2024-01-18 ENCOUNTER — Encounter: Payer: Self-pay | Admitting: Psychology

## 2024-01-18 DIAGNOSIS — F3289 Other specified depressive episodes: Secondary | ICD-10-CM

## 2024-01-18 NOTE — Progress Notes (Signed)
 [facility] Counselor/Therapist Progress Note  Patient ID: [name], MRN: [id number]   Date: 01/18/24  Time Spent: 2:05  pm - 2:55 pm : 50 Minutes  Treatment Type: Individual Therapy.  Reported Symptoms: Depression and anxiety.   Mental Status Exam: Appearance:  Neat and Well Groomed     Behavior: Appropriate  Motor: Normal  Speech/Language:  Clear and Coherent  Affect: Congruent  Mood: dysthymic  Thought process: normal  Thought content:   WNL  Sensory/Perceptual disturbances:   WNL  Orientation: oriented to person, place, time/date, and situation  Attention: Good  Concentration: Good  Memory: WNL  Fund of knowledge:  Good  Insight:   Good  Judgment:  Good  Impulse Control: Good   Risk Assessment: Danger to Self:  No Self-injurious Behavior: No Danger to Others: No Duty to Warn:no Physical Aggression / Violence:No  Access to Firearms a concern: No  Gang Involvement:No   Subjective:   Kenney Peacemaker participated from home, via video and consented to treatment. Therapist participated from home office. I discussed the limitations of evaluation and management by telemedicine and the availability of in person appointments. The patient expressed understanding and agreed to proceed. Baron reviewed the events of the past week.   We reviewed numerous treatment approaches including CBT, BA, Problem Solving, and Solution focused therapy. Psych-education regarding the Demarrio's diagnosis of Other depression was provided during the session. We discussed WILLOUGHBY DOELL goals treatment goals which include develop coping mechanism regarding uncertainty, identify areas of control and lack of control, reduce rumination, work on prioritizing tasks and managing worry related to, work on acceptance, manage his worry about how other's perceive him (work limitations, not seeing patient's as often as they would like), increase engagement in enjoyable activities (socializing and  self-care), managing distress and uncertainty.   Kenney Peacemaker provided verbal approval of the treatment plan.   He noted his rumination is primarily centered around work, work tasks, pending tasks, lack of control (being unable to see patients as often as he would like). He will work on creating a list of enjoyable tasks and tasks he found interesting but did not engage in yet to be processed going forward.   Interventions: Psycho-education & Goal Setting.   Diagnosis:  Other depression  Psychiatric Treatment: Yes , See chart.    Treatment Plan:  Client Abilities/Strengths Sevyn is intelligent, forthcoming, and motivated for change.   Support System: Family and friends.   Client Treatment Preferences OPT  Client Statement of Needs Zaid would like to develop coping mechanism regarding uncertainty, identify areas of control and lack of control, reduce rumination, work on prioritizing tasks and managing worry related to, work on acceptance, manage his worry about how other's perceive him (work limitations, not seeing patient's as often as they would like), increase engagement in enjoyable activities (socializing and self-care), managing distress and uncertainty.    Treatment Level Weekly  Symptoms  Anxiety: Feeling anxious, difficulty managing worry, worrying too much about different things, trouble relaxing, irritability, and feeling afraid something awful might happen, rumination.  (Status: maintained) Depression: loss of interest, feeling down, poor sleep (middle insomnia,  rumination, physical pain from repetitive tasks), feeling bad about self, difficulty concentrating.  (Status: maintained)  Goals:   Milas experiences symptoms of depression and anxiety.   Treatment plan signed and available on s-drive:   No, pending signature via MyChart.  JAASIEL HOLLYFIELD was sent the treatment plan signature form on 01/18/24.   Target  Date: 01/17/25 Frequency: Weekly  Progress: 0  Modality: individual    Therapist will provide referrals for additional resources as appropriate.  Therapist will provide psycho-education regarding Tajai's diagnosis and corresponding treatment approaches and interventions. Belva Boyden, LCSW will support the patient's ability to achieve the goals identified. will employ CBT, BA, Problem-solving, Solution Focused, Mindfulness,  coping skills, & other evidenced-based practices will be used to promote progress towards healthy functioning to help manage decrease symptoms associated with his diagnosis.   Reduce overall level, frequency, and intensity of the feelings of depression, anxiety and evidenced by decreased overall symptoms from 6 to 7 days/week to 0 to 1 days/week per client report for at least 3 consecutive months. Verbally express understanding of the relationship between feelings of depression, anxiety and their impact on thinking patterns and behaviors. Verbalize an understanding of the role that distorted thinking plays in creating fears, excessive worry, and ruminations.    Gorman Laughter participated in the creation of the treatment plan)   Belva Boyden, LCSW

## 2024-01-28 ENCOUNTER — Ambulatory Visit (INDEPENDENT_AMBULATORY_CARE_PROVIDER_SITE_OTHER): Admitting: Psychology

## 2024-01-28 DIAGNOSIS — F3289 Other specified depressive episodes: Secondary | ICD-10-CM | POA: Diagnosis not present

## 2024-01-28 NOTE — Progress Notes (Signed)
 [facility] Counselor/Therapist Progress Note  Patient ID: [name], MRN: [id number]   Date: 01/28/24  Time Spent: 8:01  am - 8:58 am : 57 Minutes  Treatment Type: Individual Therapy.  Reported Symptoms: Depression and anxiety.   Mental Status Exam: Appearance:  Neat and Well Groomed     Behavior: Appropriate  Motor: Normal  Speech/Language:  Clear and Coherent  Affect: Congruent  Mood: dysthymic  Thought process: normal  Thought content:   WNL  Sensory/Perceptual disturbances:   WNL  Orientation: oriented to person, place, time/date, and situation  Attention: Good  Concentration: Good  Memory: WNL  Fund of knowledge:  Good  Insight:   Good  Judgment:  Good  Impulse Control: Good   Risk Assessment: Danger to Self:  No Self-injurious Behavior: No Danger to Others: No Duty to Warn:no Physical Aggression / Violence:No  Access to Firearms a concern: No  Gang Involvement:No   Subjective:   [name] [name] participated from the office, with the therapist, and consented to treatment. [name] reviewed the events of the past week. 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 He noted his general sense of dissatisfaction at work, due to stress, but noted difficulty discerning whether this is his mood playing into his work or his work playing into his mood. He noted his effort to create a list of previously enjoyable activities but noted that working 7 days a week and haven't not made too much time to work on this. He did note his interest learning more about cooking and exercise. For exercise he noted enjoying walking, hiking, cycling, and using his [place]. He noted barriers to a lack of scheduling, real and imagined to do list, not being a morning person, work stressors and work to [employer], and mood. He noted his rumination about work tasks including pending tasks and 'What and when am I going to  do it?. He noted often perseverating about decisions at home and work. At work, he noted stress with various decisions that he had to make regularly. He noted at home that he needs to get off the couch and get things done. He provided examples regarding this and noted this being less of an issue in the past. We discussed delineating the what ifs vs tangible stressors. We discussed differentiating between various stressors. Therapist introduced worry time during the session, modeled this during the session, and provided handout via email for reference and review. He noted barriers to this including having difficulty with maintaining a schedule or routine.  We worked on discussing expectations at work and home and the importance of setting reasonable expectations. We worked on processing this during the session. [name] was engaged and motivated during the session and expressed commitment towards goals including continuing ot work on [employer] of enjoyable activities and employing worry time. He will work on using reminders to aid in remembering the tasks. Therapist validated [name]'s feelings and experience and provided supportive therapy. A follow-up was scheduled for continued treatment which he benefits from.   Interventions: Psycho-education & Goal Setting.   Diagnosis:  Other depression  Psychiatric Treatment: Yes , See chart.    Treatment Plan:  Client Abilities/Strengths [name] is intelligent, forthcoming, and motivated for change.   Support System: Family and friends.   Client Treatment Preferences OPT  Client Statement of Needs [name] would like to develop coping mechanism regarding uncertainty, identify areas  of control and lack of control, reduce rumination, work on prioritizing tasks and managing worry related to, work on acceptance, manage his worry about how other's perceive him (work limitations, not seeing patient's as often as they would like), increase engagement in enjoyable  activities (socializing and self-care), managing distress and uncertainty.    Treatment Level Weekly  Symptoms  Anxiety: Feeling anxious, difficulty managing worry, worrying too much about different things, trouble relaxing, irritability, and feeling afraid something awful might happen, rumination.  (Status: maintained) Depression: loss of interest, feeling down, poor sleep (middle insomnia,  rumination, physical pain from repetitive tasks), feeling bad about self, difficulty concentrating.  (Status: maintained)  Goals:   [name] experiences symptoms of depression and anxiety.   Treatment plan signed and available on s-drive:   No, pending signature via MyChart.  [name] was sent the treatment plan signature form on 01/18/24.   Target Date: 01/17/25 Frequency: Weekly  Progress: 0 Modality: individual    Therapist will provide referrals for additional resources as appropriate.  Therapist will provide psycho-education regarding [name]'s diagnosis and corresponding treatment approaches and interventions. [name], [name] will support the patient's ability to achieve the goals identified. will employ CBT, BA, Problem-solving, Solution Focused, Mindfulness,  coping skills, & other evidenced-based practices will be used to promote progress towards healthy functioning to help manage decrease symptoms associated with his diagnosis.   Reduce overall level, frequency, and intensity of the feelings of depression, anxiety and evidenced by decreased overall symptoms from 6 to 7 days/week to 0 to 1 days/week per client report for at least 3 consecutive months. Verbally express understanding of the relationship between feelings of depression, anxiety and their impact on thinking patterns and behaviors. Verbalize an understanding of the role that distorted thinking plays in creating fears, excessive worry, and ruminations.    [name] participated in the creation of the treatment plan)   [name], [name]

## 2024-02-13 ENCOUNTER — Other Ambulatory Visit (HOSPITAL_COMMUNITY): Payer: Self-pay

## 2024-02-13 ENCOUNTER — Ambulatory Visit (INDEPENDENT_AMBULATORY_CARE_PROVIDER_SITE_OTHER): Admitting: Psychology

## 2024-02-13 DIAGNOSIS — F3289 Other specified depressive episodes: Secondary | ICD-10-CM | POA: Diagnosis not present

## 2024-02-13 MED ORDER — CELECOXIB 200 MG PO CAPS
200.0000 mg | ORAL_CAPSULE | Freq: Every day | ORAL | 1 refills | Status: DC
  Filled 2024-02-13: qty 60, 60d supply, fill #0

## 2024-02-13 NOTE — Progress Notes (Signed)
 [name] Counselor/Therapist Progress Note  Patient ID: [name], MRN: [id number]   Date: 02/13/24  Time Spent: 12:03 pm - 1:00 pm : 57 Minutes   Treatment Type: Individual Therapy.  Reported Symptoms: Depression and anxiety.   Mental Status Exam: Appearance:  Neat and Well Groomed     Behavior: Appropriate  Motor: Normal  Speech/Language:  Clear and Coherent  Affect: Congruent  Mood: dysthymic  Thought process: normal  Thought content:   WNL  Sensory/Perceptual disturbances:   WNL  Orientation: oriented to person, place, time/date, and situation  Attention: Good  Concentration: Good  Memory: WNL  Fund of knowledge:  Good  Insight:   Good  Judgment:  Good  Impulse Control: Good   Risk Assessment: Danger to Self:  No Self-injurious Behavior: No Danger to Others: No Duty to Warn:no Physical Aggression / Violence:No  Access to Firearms a concern: No  Gang Involvement:No   Subjective:   [name] participated from his home via video and consented to treatment. Therapist participated from home office. [name] reviewed the events of the past week. Child noted his efforts to employ postponing worry time but noted this being difficult due to developing the routine. We worked on exploring this during the session. He noted his primary stress being his dissatisfaction with work and what am I going to do about it. We worked on problem-solving this, during the session, to address barriers. He noted a fluctuating schedule, various responsibilities, and unexpected barriers. We worked on problem solving this during the session. We discussed the importance of identifying areas of control and lack of control. We discussed scheduling worry time on a daily basis to account for his varying schedule. [name] noted I am kind of obsessed with work regarding what decision I am going to make about work and noted stress about this. We worked on processing this and therapist  highlighted [name] focus on the destination or final decision-making verse taking incremental steps and viewing this as a process. He is taking some steps to identify career trajectory including retirement and is meeting with his Forensic scientist. He noted having some ideas of how to improve his work experience including less responsibility. 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 We explored this option during the session. Therapist provided general overview of Behavioral Activation (BA) and provided the handout via email for reference and review. We will work on this going forward beginning with our follow-up appointment. [name] will work on completing the activity log to account for his time during the week. Additionally, he will focus on identifying areas of control and lack of control and engaging in establishing worry time. Therapist validated [name]'s feelings and experience, encouraged proactive mood management, and focusing on engagement in enjoyable activities. Therapist provided supportive therapy and a follow-up was scheduled for continued treatment, which he benefits from.   Interventions: Psycho-education & Goal Setting.   Diagnosis:  Other depression  Psychiatric Treatment: Yes , See chart.    Treatment Plan:  Client Abilities/Strengths [name] is intelligent, forthcoming, and motivated for change.   Support System: Family and friends.   Client Treatment Preferences OPT  Client Statement of Needs [name] would like to develop coping mechanism regarding uncertainty, identify areas of control and lack of control, reduce rumination, work on prioritizing tasks and managing worry related to, work on acceptance, manage his worry about how other's perceive him (  work limitations, not seeing patient's as often as they would like), increase engagement in  enjoyable activities (socializing and self-care), managing distress and uncertainty.    Treatment Level Weekly  Symptoms  Anxiety: Feeling anxious, difficulty managing worry, worrying too much about different things, trouble relaxing, irritability, and feeling afraid something awful might happen, rumination.  (Status: maintained) Depression: loss of interest, feeling down, poor sleep (middle insomnia,  rumination, physical pain from repetitive tasks), feeling bad about self, difficulty concentrating.  (Status: maintained)  Goals:   [name] experiences symptoms of depression and anxiety.   Treatment plan signed and available on s-drive:   No, pending signature via MyChart.  [name] was sent the treatment plan signature form on 01/18/24.   Target Date: 01/17/25 Frequency: Weekly  Progress: 0 Modality: individual    Therapist will provide referrals for additional resources as appropriate.  Therapist will provide psycho-education regarding [name]'s diagnosis and corresponding treatment approaches and interventions. Donald Mullet, LCSW will support the patient's ability to achieve the goals identified. will employ CBT, BA, Problem-solving, Solution Focused, Mindfulness,  coping skills, & other evidenced-based practices will be used to promote progress towards healthy functioning to help manage decrease symptoms associated with his diagnosis.   Reduce overall level, frequency, and intensity of the feelings of depression, anxiety and evidenced by decreased overall symptoms from 6 to 7 days/week to 0 to 1 days/week per client report for at least 3 consecutive months. Verbally express understanding of the relationship between feelings of depression, anxiety and their impact on thinking patterns and behaviors. Verbalize an understanding of the role that distorted thinking plays in creating fears, excessive worry, and ruminations.    [name] participated in the creation of the treatment  plan)   Donald Mullet, LCSW

## 2024-02-18 ENCOUNTER — Other Ambulatory Visit (HOSPITAL_COMMUNITY): Payer: Self-pay

## 2024-02-20 ENCOUNTER — Other Ambulatory Visit (HOSPITAL_COMMUNITY): Payer: Self-pay

## 2024-02-20 MED ORDER — METHOCARBAMOL 500 MG PO TABS
750.0000 mg | ORAL_TABLET | Freq: Three times a day (TID) | ORAL | 1 refills | Status: AC | PRN
  Filled 2024-02-20: qty 60, 14d supply, fill #0

## 2024-02-25 ENCOUNTER — Ambulatory Visit (INDEPENDENT_AMBULATORY_CARE_PROVIDER_SITE_OTHER): Admitting: Psychology

## 2024-02-25 DIAGNOSIS — F3289 Other specified depressive episodes: Secondary | ICD-10-CM

## 2024-02-25 NOTE — Progress Notes (Signed)
 [facility] Counselor/Therapist Progress Note  Patient ID: [name], MRN: [id number]   Date: 02/25/24  Time Spent: 8:01 am - 9:02 am : 61 Minutes   Treatment Type: Individual Therapy.  Reported Symptoms: Depression and anxiety.   Mental Status Exam: Appearance:  Neat and Well Groomed     Behavior: Appropriate  Motor: Normal  Speech/Language:  Clear and Coherent  Affect: Congruent  Mood: dysthymic  Thought process: normal  Thought content:   WNL  Sensory/Perceptual disturbances:   WNL  Orientation: oriented to person, place, time/date, and situation  Attention: Good  Concentration: Good  Memory: WNL  Fund of knowledge:  Good  Insight:   Good  Judgment:  Good  Impulse Control: Good   Risk Assessment: Danger to Self:  No Self-injurious Behavior: No Danger to Others: No Duty to Warn:no Physical Aggression / Violence:No  Access to Firearms a concern: No  Gang Involvement:No   Subjective:   [name] participated from his home via video and consented to treatment. Therapist participated from home office. [name] reviewed the events of the past week. He noted being slated to meet with Dr. Viviane Drone for an initial psychiatric provider on 02/29/24. He connecting with a provider colleague who connected him with an executive coach who could provide guidance. He is slated to meet with his financial planner next week. He noted his colleague, [name], providing empathy and support. He noted completing the activity log and noted his mood up and down, reactive depending on work related stressors. He noted that, when engaged, his mood is more positive. He noted that he will ruminate when not engaged. We reviewed his activity log during the session and [name] noted his mood often dipping when reflecting on work. He noted often waking in the middle of the night to urinate and noted often ruminating and having difficulty going back to sleep, at times. He noted the past few  weeks having more free floating anxiety. He noted his intent on creating a list of options for work related to viable options including retirement, working less, Scientist, product/process development, etc. He noted the rumination about work, in the big picture, and not specific tasks. He stated, about work, that It doesn't float my broad like it used and noted  ' how much is work the source of my unhappiness, He noted not getting satisfaction out of getting things done, from work, in the past. He noted the difficulty when he is unable to meet the needs. If you don't it on, you disappoint others. He noted previously seeking recognition but denied this in a recent past. He noted his previous sources of happiness family life, getting home projects completed. He noted that his children have left the home and that his motivation for household tasks has waned. Therapist encouraged [name] to continue reading the [facility] handout, work on creating a list of work tasks that were/are satisfying and meaningful, and to be more purposeful with his engagement with others day-to-day and to identify certain aspects of work related tasks that are enjoyable and to be mindful of this on the onset and adopt positive language. [name] was engaged and motivated during the session. He expressed commitment towards goals. Therapist validated [name]'s feelings and experience and provided supportive therapy. A follow-up was scheduled for continued treatment, which he benefits from. .   Interventions: CBT  Diagnosis:  Other depression  Psychiatric Treatment: Yes , See chart.    Treatment Plan:  Client Abilities/Strengths [name] is intelligent, forthcoming,  and motivated for change.   Support System: Family and friends.   Client Treatment Preferences OPT  Client Statement of Needs [name] would like to develop coping mechanism regarding uncertainty, identify areas of control and lack of control, reduce rumination, work on prioritizing tasks and  managing worry related to, work on acceptance, manage his worry about how other's perceive him (work limitations, not seeing patient's as often as they would like), increase engagement in enjoyable activities (socializing and self-care), managing distress and uncertainty.    Treatment Level Weekly  Symptoms  Anxiety: Feeling anxious, difficulty managing worry, worrying too much about different things, trouble relaxing, irritability, and feeling afraid something awful might happen, rumination.  (Status: maintained) Depression: loss of interest, feeling down, poor sleep (middle insomnia,  rumination, physical pain from repetitive tasks), feeling bad about self, difficulty concentrating.  (Status: maintained)  Goals:   Zimri experiences symptoms of depression and anxiety.   Treatment plan signed and available on s-drive:   No, pending signature via MyChart.  [name] was sent the treatment plan signature form on 01/18/24.   Target Date: 01/17/25 Frequency: Weekly  Progress: 0 Modality: individual    Therapist will provide referrals for additional resources as appropriate.  Therapist will provide psycho-education regarding Jeffry's diagnosis and corresponding treatment approaches and interventions. [name], LCSW will support the patient's ability to achieve the goals identified. will employ CBT, BA, Problem-solving, Solution Focused, Mindfulness,  coping skills, & other evidenced-based practices will be used to promote progress towards healthy functioning to help manage decrease symptoms associated with his diagnosis.   Reduce overall level, frequency, and intensity of the feelings of depression, anxiety and evidenced by decreased overall symptoms from 6 to 7 days/week to 0 to 1 days/week per client report for at least 3 consecutive months. Verbally express understanding of the relationship between feelings of depression, anxiety and their impact on thinking patterns and  behaviors. Verbalize an understanding of the role that distorted thinking plays in creating fears, excessive worry, and ruminations.    [name] participated in the creation of the treatment plan)   [name], LCSW

## 2024-02-29 ENCOUNTER — Other Ambulatory Visit (HOSPITAL_COMMUNITY): Payer: Self-pay

## 2024-02-29 MED ORDER — TRAZODONE HCL 50 MG PO TABS
50.0000 mg | ORAL_TABLET | Freq: Every day | ORAL | 0 refills | Status: AC
  Filled 2024-02-29: qty 60, 30d supply, fill #0

## 2024-02-29 MED ORDER — SERTRALINE HCL 50 MG PO TABS
50.0000 mg | ORAL_TABLET | Freq: Every day | ORAL | 0 refills | Status: DC
  Filled 2024-02-29: qty 30, 30d supply, fill #0

## 2024-03-05 NOTE — Progress Notes (Unsigned)
   Donald Ileana Collet, PhD, LAT, ATC acting as a scribe for Artist Lloyd, MD.  [name] is a 63 y.o. male who presents to [facility] today for neck and L arm pain. Pt was previously seen by Dr. Lloyd on 12/28/21 for bilat shoulder pain.  Today, pt c/o neck and L arm pain x ***. Pt locates pain to ***  Radiates:  UE Numbness/tingling: UE Weakness: Aggravates: Treatments tried:  Dx imaging: 02/13/24 L hand XR  09/12/23 L elbow XR 04/10/20 Cervical CT             02/23/20 L shoulder XR             04/11/07 Cervical MRI  Pertinent review of systems: ***  Relevant historical information: ***   Exam:  There were no vitals taken for this visit. General: Well Developed, well nourished, and in no acute distress.   MSK: ***    Lab and Radiology Results No results found for this or any previous visit (from the past 72 hours). No results found.     Assessment and Plan: 62 y.o. male with ***   PDMP not reviewed this encounter. No orders of the defined types were placed in this encounter.  No orders of the defined types were placed in this encounter.    Discussed warning signs or symptoms. Please see discharge instructions. Patient expresses understanding.   ***

## 2024-03-06 ENCOUNTER — Encounter: Payer: Self-pay | Admitting: Family Medicine

## 2024-03-06 ENCOUNTER — Other Ambulatory Visit (HOSPITAL_COMMUNITY): Payer: Self-pay

## 2024-03-06 ENCOUNTER — Ambulatory Visit (INDEPENDENT_AMBULATORY_CARE_PROVIDER_SITE_OTHER): Payer: Self-pay | Admitting: Family Medicine

## 2024-03-06 ENCOUNTER — Ambulatory Visit (INDEPENDENT_AMBULATORY_CARE_PROVIDER_SITE_OTHER)

## 2024-03-06 VITALS — BP 130/86 | HR 70 | Ht 71.0 in | Wt 169.0 lb

## 2024-03-06 DIAGNOSIS — M79602 Pain in left arm: Secondary | ICD-10-CM

## 2024-03-06 DIAGNOSIS — M542 Cervicalgia: Secondary | ICD-10-CM | POA: Diagnosis not present

## 2024-03-06 MED ORDER — PREDNISONE 50 MG PO TABS
50.0000 mg | ORAL_TABLET | Freq: Every day | ORAL | 0 refills | Status: AC
  Filled 2024-03-06: qty 5, 5d supply, fill #0

## 2024-03-06 NOTE — Patient Instructions (Addendum)
 Thank you for coming in today.   Please get an Xray today before you leave   A referral has been placed for Occupation Therapy with [facility], you should hear from them about scheduling over the next 1-2 weeks.   See you back as needed.

## 2024-03-13 ENCOUNTER — Ambulatory Visit (INDEPENDENT_AMBULATORY_CARE_PROVIDER_SITE_OTHER): Admitting: Psychology

## 2024-03-13 DIAGNOSIS — F3289 Other specified depressive episodes: Secondary | ICD-10-CM | POA: Diagnosis not present

## 2024-03-13 NOTE — Therapy (Signed)
 OUTPATIENT OCCUPATIONAL THERAPY ORTHO EVALUATION  Patient Name: [name] MRN: [id number] DOB:[birth year], 63 y.o., male Today's Date: 03/17/2024  PCP: Loreli MOHR MD REFERRING PROVIDER: Joane Artist RAMAN, MD   END OF SESSION:  OT End of Session - 03/17/24 0933     Visit Number 1    Number of Visits 10    Date for OT Re-Evaluation 05/02/24    Authorization Type [employer]    OT Start Time [phone]    OT Stop Time 1023    OT Time Calculation (min) 50 min    Activity Tolerance Patient tolerated treatment well;No increased pain;Patient limited by fatigue;Patient limited by pain    Behavior During Therapy [facility] for tasks assessed/performed          Past Medical History:  Diagnosis Date   Allergy    SEASONAL   Duodenal ulcer with hemorrhage    X2   Gilbert's syndrome    History of kidney stones    Hypertension    no meds   IBS (irritable bowel syndrome)    Internal hemorrhoids    Nephrolithiasis    Osteoarthritis    Seasonal affective disorder (HCC)    Vitamin D deficiency    Past Surgical History:  Procedure Laterality Date   COLONOSCOPY  1998   ESOPHAGOGASTRODUODENOSCOPY  1992   EXTRACORPOREAL SHOCK WAVE LITHOTRIPSY Left 05/21/2023   Procedure: LEFT EXTRACORPOREAL SHOCK WAVE LITHOTRIPSY (ESWL);  Surgeon: Nieves Cough, MD;  Location: [facility];  Service: Urology;  Laterality: Left;   OPEN REDUCTION INTERNAL FIXATION (ORIF) DISTAL RADIAL FRACTURE Left 04/13/2020   Procedure: OPEN REDUCTION INTERNAL FIXATION (ORIF) LEFT DISTAL RADIUS FRACTURE;  Surgeon: Murrell Drivers, MD;  Location: [facility];  Service: Orthopedics;  Laterality: Left;   OPEN REDUCTION INTERNAL FIXATION (ORIF) METACARPAL Left 04/13/2020   Procedure: OPEN REDUCTION INTERNAL FIXATION (ORIF) LEFT SMALL METACARPAL;  Surgeon: Murrell Drivers, MD;  Location: [facility];  Service: Orthopedics;  Laterality: Left;   OPEN REDUCTION INTERNAL FIXATION (ORIF) PROXIMAL PHALANX Right  04/13/2020   Procedure: OPEN REDUCTION AND PINNING WITH APPLICATION OF EXTERNAL FIXATOR RIGHT PROXIMAL PHALANX;  Surgeon: Murrell Drivers, MD;  Location: [facility];  Service: Orthopedics;  Laterality: Right;   TONSILLECTOMY AND ADENOIDECTOMY  1972   Patient Active Problem List   Diagnosis Date Noted   KIDNEY STONE 06/21/2007   Allergic rhinitis 05/06/2007    ONSET DATE: chronic pain   REFERRING DIAG:  M54.2 (ICD-10-CM) - Cervicalgia  M79.602 (ICD-10-CM) - Left arm pain    THERAPY DIAG:  Muscle weakness (generalized)  Chronic left shoulder pain  Stiffness of left shoulder, not elsewhere classified  Pain in left elbow  Pain in left hand  Stiffness of right hand, not elsewhere classified  Rationale for Evaluation and Treatment: Rehabilitation  SUBJECTIVE:   SUBJECTIVE STATEMENT: 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  He will have intermittent swelling and pain through the left wrist as well as some neck and shoulder stiffness and pain associated also with DJD of the neck.  He states that his neck has been flaring up lately.  He also states history of shoulder impingement and pain especially in the left arm.     PERTINENT HISTORY: Per MD note: pt c/o neck and L arm pain x several  years. Pt locates pain to the shoulder, elbow, forearm, and hand. Also notes intermittent left-sided neck pain. Has imaging from hand surgeon. Had rheumatology labs which came back negative. Denies weakness in the L UE. Some pain in the wrist and across the knuckles. Intermittent n/t.[id number] y.o. male with left trapezius pain associated with left forearm and hand pain.  These are 2 separate issues and very unlikely to be related to cervical radiculopathy.   For trapezius pain work on ergonomics at work and refer to hand  therapy and potentially PT in the future.   Left forearm and hand pain due to some component of tennis elbow and some hand DJD.  Plan for OT.  This should be quite helpful.  Recommend heat as well.   I did prescribe a course of prednisone  that Jazon can start using if needed.  PRECAUTIONS: None  RED FLAGS: None   WEIGHT BEARING RESTRICTIONS: No  PAIN:  Are you having pain? Yes: NPRS scale: 2/10, 5/10 Pain location: From left side of neck down the shoulder into elbow and dorsal forearm Pain description: Aching and sore Aggravating factors: Push-ups and repetitive activities Relieving factors: Rest  FALLS: Has patient fallen in last 6 months? No  PLOF: Independent  PATIENT GOALS: To improve pain and symptoms through the left arm  NEXT MD VISIT: As needed   OBJECTIVE: (All objective assessments below are from initial evaluation on: 03/17/24 unless otherwise specified.)   HAND DOMINANCE: Right   ADLs: Overall ADLs: States decreased ability to grab, hold household objects, higher level IADLs  FUNCTIONAL OUTCOME MEASURES: Eval: Patient Specific Functional Scale: 8 (work(scope), sleep, yoga)  (Higher Score  =  Better Ability for the Selected Tasks)      UPPER EXTREMITY ROM     Shoulder to Wrist AROM Right eval Left eval  Shoulder flexion 142 136  Shoulder abduction 163 132  Shoulder extension 66 65  Shoulder internal rotation 55 50  Shoulder external rotation 85 80  Elbow flexion    Elbow extension    Forearm supination  78  Forearm pronation   66  Wrist flexion  71  Wrist extension  69  (Blank rows = not tested)   Hand AROM Left eval  Full Fist Ability (or Gap to Distal Palmar Crease) Full fist  Thumb Opposition  (Kapandji Scale)  Within functional limits  (Blank rows = not tested)   UPPER EXTREMITY MMT:    Eval: He has some overt pain and weakness with wrist extension, also some weakness with associated impingement pain in the left shoulder.  Specific  details will be tested in upcoming sessions as time allows.  MMT Left TBD  Shoulder flexion   Shoulder abduction   Shoulder adduction   Shoulder extension   Shoulder internal rotation   Shoulder external rotation   Middle trapezius   Lower trapezius   Elbow flexion   Elbow extension   Forearm supination   Forearm pronation   Wrist flexion   Wrist extension   Wrist ulnar deviation   Wrist radial deviation   (Blank rows = not tested)  HAND FUNCTION: Eval: No significant weakness in bilateral grip Grip strength Right: 100 lbs, Left: 92 lbs   COORDINATION: Eval: Some mild reported and observed gross motor coordination issues related to stiffness and weakness and pain through the lateral elbow and neck and shoulder areas of the left arm.  Details will be tested in the future as needed to help treat symptoms.  Otherwise we will  rely on subjective reports  SENSATION: Eval:  Light touch intact today   EDEMA:   Eval: None  COGNITION: Eval: Overall cognitive status: WFL for evaluation today   OBSERVATIONS:   Eval: He has what presents as chain tightness from the base of the left side of his neck and through the scapula running through the shoulder girdle the tricep lateral part of the elbow and forearm into the back of the hand and wrist.  This is very common to happen with a stiff shoulder and lateral epicondylitis.  It is somewhat subacute as he is managing it somewhat, but then it becomes exacerbated again.  OT will help him break this cycle   TODAY'S TREATMENT:  Post-evaluation treatment:   For safety/self-care he was recommended to avoid postures and positions that cause extreme wrist extension or repetitive wrist extension or lifting with the palm down.  He was also told to avoid any painful clicking or popping or repetitive motions at the neck due to DJD problems.  He should perform the following home exercises/activities to help relieve the tension that he has through his  upper traps and levator scapula running through the infraspinatus and external rotators down the triceps and into the lateral forearm:   Exercises - Standing neck/upper traps stretch  - 2-3 x daily - 3-5 reps - 15 sec hold - Sleeper Stretch  - 2-3 x daily - 5 reps - 15-20 sec hold - Triceps Stretch- Do on back of chair   - 2-3 x daily - 3-5 reps - 15 hold - Forearm Pronation Stretch  - 2-3 x daily - 3-5 reps - 15 sec hold - Wrist Prayer Stretch  - 2-3 x daily - 3-5 reps - 15 sec hold - Seated Wrist Flexion Stretch  - 2-3 x daily - 3 reps - 15 second  hold - Supine Shoulder External Rotation in Abduction (ROBBER with chin tuck) - 2-3 x daily - 4-5 x weekly - 1-2 sets - 5-10 reps - 10 sec hold -radial nerve gliding   These were performed with him today to ensure that they were nonpainful, nonrepetitive, and felt relieving to his symptoms.  He states feeling encouraged by this approach to look at his whole arm as a potential issue for kinetic chain problem that is affecting his pain mobility and quality of life.    PATIENT EDUCATION: Education details: See tx section above for details  Person educated: Patient Education method: Verbal Instruction, Teach back, Handouts  Education comprehension: States and demonstrates understanding, Additional Education required    HOME EXERCISE PROGRAM: Access Code: [email or web address] URL: [email or web address] Date: 03/17/2024 Prepared by: Melvenia Ada   GOALS: Goals reviewed with patient? Yes   SHORT TERM GOALS: (STG required if POC>30 days) Target Date: 04/04/2024  Pt will demo/state understanding of initial HEP to improve pain levels and prerequisite motion. Goal status: INITIAL   LONG TERM GOALS: Target Date: 05/02/2024  Pt will improve functional ability by decreased impairment per PSFS assessment from 8 to 9 or better, for better quality of life. Goal status: INITIAL  2.  Pt will improve A/ROM in left shoulder abduction from  130 to to at least 150, to have functional motion for tasks like reach and grasp.  Goal status: INITIAL  3.  Pt will improve strength in left wrist extension to at least 4+/5 MMT to have increased functional ability to carry out selfcare and higher-level homecare tasks with less difficulty. Goal status: INITIAL  4.  Pt will decrease  pain at worst from 5/10 to 1/10 or better to have better sleep and occupational participation in daily roles. Goal status: INITIAL   ASSESSMENT:  CLINICAL IMPRESSION: Patient is a 63 y.o. male who was seen today for occupational therapy evaluation for pain and stiffness from the left side of the neck down through the shoulder, through the humerus into the elbow and down the forearm to the hand which decreases his functional ability, his quality of life, etc.  The patient will benefit from outpatient occupational therapy to decrease symptoms, improve functional upper extremity use, and increase quality of life.  PERFORMANCE DEFICITS: in functional skills including IADLs, ROM, strength, pain, fascial restrictions, muscle spasms, Gross motor control, body mechanics, endurance, and UE functional use, cognitive skills including problem solving and safety awareness, and psychosocial skills including coping strategies, environmental adaptation, habits, and routines and behaviors.   IMPAIRMENTS: are limiting patient from ADLs, IADLs, rest and sleep, and leisure.   COMORBIDITIES: may have co-morbidities  that affects occupational performance. Patient will benefit from skilled OT to address above impairments and improve overall function.  MODIFICATION OR ASSISTANCE TO COMPLETE EVALUATION: No modification of tasks or assist necessary to complete an evaluation.  OT OCCUPATIONAL PROFILE AND HISTORY: Problem focused assessment: Including review of records relating to presenting problem.  CLINICAL DECISION MAKING: Moderate - several treatment options, min-mod task modification  necessary  REHAB POTENTIAL: Excellent  EVALUATION COMPLEXITY: Low      PLAN:  OT FREQUENCY: 1-2x/week  OT DURATION: 6 weeks through 05/02/2024 and up to 10 total visits as needed   PLANNED INTERVENTIONS: 97535 self care/ADL training, 02889 therapeutic exercise, 97530 therapeutic activity, 97112 neuromuscular re-education, 97140 manual therapy, 97035 ultrasound, 97032 electrical stimulation (manual), 97760 Orthotic Initial, [id number] Orthotic/Prosthetic subsequent, compression bandaging, Dry needling, energy conservation, coping strategies training, and patient/family education  RECOMMENDED OTHER SERVICES: none now    CONSULTED AND AGREED WITH PLAN OF CARE: Patient  PLAN FOR NEXT SESSION:   Review initial HEP and recommendations, check a thorough MMT   Tawni Melkonian, OTR/L, CHT  03/17/2024, 5:27 PM

## 2024-03-13 NOTE — Progress Notes (Signed)
 [facility] Counselor/Therapist Progress Note  Patient ID: [name], MRN: [id number]   Date: 03/13/24  Time Spent: 9:05 am - 9:55am : 50 Minutes   Treatment Type: Individual Therapy.  Reported Symptoms: Depression and anxiety.   Mental Status Exam: Appearance:  Neat and Well Groomed     Behavior: Appropriate  Motor: Normal  Speech/Language:  Clear and Coherent  Affect: Congruent  Mood: dysthymic  Thought process: normal  Thought content:   WNL  Sensory/Perceptual disturbances:   WNL  Orientation: oriented to person, place, time/date, and situation  Attention: Good  Concentration: Good  Memory: WNL  Fund of knowledge:  Good  Insight:   Good  Judgment:  Good  Impulse Control: Good   Risk Assessment: Danger to Self:  No Self-injurious Behavior: No Danger to Others: No Duty to Warn:no Physical Aggression / Violence:No  Access to Firearms a concern: No  Gang Involvement:No   Subjective:   [name] participated from his home via video and consented to treatment. Therapist participated from home office. Dewan reviewed the events of the past week. Edgardo noted meeting with Dr. Viviane Drone ~1.5 weeks ago and noted being prescribed sertraline  50 mg every day & trazodone  50 - 100mg . He noted his psychiatrist noted the primary focus being anxiety. He is taking his medication as prescribed and is slated to meet his provider in the next two weeks. He noted speaking with a career coach but noted that he is unable to engaging in this, at this time, due to availability. He noted further review of the [facility] handout and noted areas that resonated with him including gain additional mastery  at work. He noted Dr. Drone providing him with a handout reviewing areas of control and lack of control and staying present. He endorsed rumination and noted this frequently occurring when waking during the night. He noted his varying options going forward including possible earlier  retirement but noted a sense of responsibility towards his group and patients. He delineated his work related frustrations including difficulty with not having the capacity to provide care in a timely manner, as he would like, as they once have. He noted worry that he would fail, patients are going to suffer, that something would be missed, and that the patient's symptoms would worsen. He noted contributing factors that have resulted in increased workload including more demand, having to supervise mid-level providers, more non-facing tasks. We worked on exploring expectations and discussing ways to shift expectations in a reasonable manner. He noted as I look back, I was an over doer He noted his previous approach being more than possibly reasonable. I was trying to meet them where they are, at the cost of me but noted this being meaningful to him. He noted previously overextending and noted this could be more helpful for the staff or the patient. We worked on processing the effect of this on him and his expectations. We discussed need for self vs others. He noted previously being the first person to volunteer for tasks but noted his effort to reduce this behavior. Therapist encouraged Carlen to delineate his past expectations, on paper, and work towards identifying what shifts would make in his expectations. He noted meeting with his financial planner and noted being in a great financial standing which would allow him to retire, if he chooses. He noted difficulty with managing rumination and noted examples including diagnostic uncertainty, how to manage staffing, are we going to be able to hire a nurse  leader. He noted difficulty waiting on the process of labs, etc and noted this being different previously. He noted part of the drive is fixing things and pleasing people. Thearpist encouraged Dairon to work on exploring these between sessions so that we can process this during our follow-up.  Additional interruptions to sleep include having to urinate and his cat waking him. Therapist validated Slayton's feelings and experience during the session. Verdell was engaged and motivated during the session. He expressed commitment towards goals. Therapist praised Ethyn for his effort and provided supportive therapy. A follow-up was scheduled for continued treatment, which he benefits from.   Interventions: CBT  Diagnosis:  Other depression  Psychiatric Treatment: Yes , See chart.    Treatment Plan:  Client Abilities/Strengths Cobi is intelligent, forthcoming, and motivated for change.   Support System: Family and friends.   Client Treatment Preferences OPT  Client Statement of Needs Amelia would like to develop coping mechanism regarding uncertainty, identify areas of control and lack of control, reduce rumination, work on prioritizing tasks and managing worry related to, work on acceptance, manage his worry about how other's perceive him (work limitations, not seeing patient's as often as they would like), increase engagement in enjoyable activities (socializing and self-care), managing distress and uncertainty.    Treatment Level Weekly  Symptoms  Anxiety: Feeling anxious, difficulty managing worry, worrying too much about different things, trouble relaxing, irritability, and feeling afraid something awful might happen, rumination.  (Status: maintained) Depression: loss of interest, feeling down, poor sleep (middle insomnia,  rumination, physical pain from repetitive tasks), feeling bad about self, difficulty concentrating.  (Status: maintained)  Goals:   Jeric experiences symptoms of depression and anxiety.   Treatment plan signed and available on s-drive:   No, pending signature via MyChart.  ACEYN KATHOL was sent the treatment plan signature form on 01/18/24.   Target Date: 01/17/25 Frequency: Weekly  Progress: 0 Modality: individual    Therapist will provide referrals  for additional resources as appropriate.  Therapist will provide psycho-education regarding Ermon's diagnosis and corresponding treatment approaches and interventions. [name], LCSW will support the patient's ability to achieve the goals identified. will employ CBT, BA, Problem-solving, Solution Focused, Mindfulness,  coping skills, & other evidenced-based practices will be used to promote progress towards healthy functioning to help manage decrease symptoms associated with his diagnosis.   Reduce overall level, frequency, and intensity of the feelings of depression, anxiety and evidenced by decreased overall symptoms from 6 to 7 days/week to 0 to 1 days/week per client report for at least 3 consecutive months. Verbally express understanding of the relationship between feelings of depression, anxiety and their impact on thinking patterns and behaviors. Verbalize an understanding of the role that distorted thinking plays in creating fears, excessive worry, and ruminations.    [name] participated in the creation of the treatment plan)   [name], LCSW

## 2024-03-14 ENCOUNTER — Ambulatory Visit: Payer: Self-pay | Admitting: Family Medicine

## 2024-03-17 ENCOUNTER — Ambulatory Visit: Admitting: Rehabilitative and Restorative Service Providers"

## 2024-03-17 ENCOUNTER — Encounter: Payer: Self-pay | Admitting: Rehabilitative and Restorative Service Providers"

## 2024-03-17 ENCOUNTER — Other Ambulatory Visit (HOSPITAL_COMMUNITY): Payer: Self-pay

## 2024-03-17 DIAGNOSIS — M25612 Stiffness of left shoulder, not elsewhere classified: Secondary | ICD-10-CM

## 2024-03-17 DIAGNOSIS — M25522 Pain in left elbow: Secondary | ICD-10-CM | POA: Diagnosis not present

## 2024-03-17 DIAGNOSIS — M79642 Pain in left hand: Secondary | ICD-10-CM

## 2024-03-17 DIAGNOSIS — G8929 Other chronic pain: Secondary | ICD-10-CM

## 2024-03-17 DIAGNOSIS — M25641 Stiffness of right hand, not elsewhere classified: Secondary | ICD-10-CM

## 2024-03-17 DIAGNOSIS — M6281 Muscle weakness (generalized): Secondary | ICD-10-CM | POA: Diagnosis not present

## 2024-03-17 DIAGNOSIS — M25512 Pain in left shoulder: Secondary | ICD-10-CM

## 2024-03-17 MED ORDER — SERTRALINE HCL 100 MG PO TABS
100.0000 mg | ORAL_TABLET | Freq: Every day | ORAL | 0 refills | Status: AC
  Filled 2024-03-17: qty 90, 90d supply, fill #0

## 2024-03-17 MED ORDER — TRAZODONE HCL 100 MG PO TABS
100.0000 mg | ORAL_TABLET | Freq: Every evening | ORAL | 0 refills | Status: AC
  Filled 2024-03-17: qty 180, 90d supply, fill #0

## 2024-03-18 ENCOUNTER — Other Ambulatory Visit (HOSPITAL_COMMUNITY): Payer: Self-pay

## 2024-03-31 ENCOUNTER — Ambulatory Visit (INDEPENDENT_AMBULATORY_CARE_PROVIDER_SITE_OTHER): Admitting: Psychology

## 2024-03-31 DIAGNOSIS — F3289 Other specified depressive episodes: Secondary | ICD-10-CM | POA: Diagnosis not present

## 2024-03-31 NOTE — Progress Notes (Signed)
 [facility] Counselor/Therapist Progress Note  Patient ID: [name], MRN: [id number]   Date: 03/31/24  Time Spent: 10:04 am - 10:42 am : 38 Minutes   Treatment Type: Individual Therapy.  Reported Symptoms: Depression and anxiety.   Mental Status Exam: Appearance:  Neat and Well Groomed     Behavior: Appropriate  Motor: Normal  Speech/Language:  Clear and Coherent  Affect: Congruent  Mood: normal  Thought process: normal  Thought content:   WNL  Sensory/Perceptual disturbances:   WNL  Orientation: oriented to person, place, time/date, and situation  Attention: Good  Concentration: Good  Memory: WNL  Fund of knowledge:  Good  Insight:   Good  Judgment:  Good  Impulse Control: Good   Risk Assessment: Danger to Self:  No Self-injurious Behavior: No Danger to Others: No Duty to Warn:no Physical Aggression / Violence:No  Access to Firearms a concern: No  Gang Involvement:No   Subjective:   [name] participated from his home via video and consented to treatment. Therapist participated from home office. [name] reviewed the events of the past week. [name] noted an increase in his Sertraline  and is currently prescribed 100 mg every day. He noted his Trazodone  prescription being increased from 100 to 200 mg. He noted outlining a list of work changes (differences from before), identifying areas of control and lack of control, and working on balance and reasonable expectations for self. He noted understanding the limits of the situations and delegating more. He noted that he was fighting work a lot and thinking it was something it isn't. 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 He noted his effort to engage in enjoyable activities and noted working on spending time outside of the home. He noted that sleep is still a challenge. We continued to explore this during the session and worked on  identifying possible contributing factors. He noted his effort to engage in enjoyable activities but discussed the limitations of engagement. He noted his effort to engage in his church men's group more often but noted having motivation issues. He noted that he doesn't have an activity, outside of work, that is challenging and stimulating in a meaningful way. We worked on exploring this during the session. 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 Therapist encouraged [name] to continue to work in this area and engage in. [name] was engaged and motivated during the session. He expressed commitment towards our goals. Therapist praised [name] for his effort and energy during the session and provided supportive therapy. A follow-up was scheduled for continued treatment, which he benefits from.    Interventions: CBT  Diagnosis:  Other depression  Psychiatric Treatment: Yes , See chart.    Treatment Plan:  Client Abilities/Strengths [name] is intelligent, forthcoming, and motivated for change.   Support System: Family and friends.   Client Treatment Preferences OPT  Client Statement of Needs [name] would like to develop coping mechanism regarding uncertainty, identify areas of control and lack of control, reduce rumination, work on prioritizing tasks and managing worry related to, work on acceptance, manage his worry about how other's perceive him (work limitations, not seeing patient's as often as they would like), increase engagement in enjoyable activities (socializing and self-care), managing distress and uncertainty.    Treatment Level Weekly  Symptoms  Anxiety: Feeling anxious, difficulty managing worry, worrying too much about different things, trouble relaxing, irritability, and feeling afraid something  awful might happen, rumination.  (Status: maintained) Depression: loss of interest, feeling down, poor sleep (middle insomnia,  rumination, physical pain from repetitive  tasks), feeling bad about self, difficulty concentrating.  (Status: maintained)  Goals:   [name] experiences symptoms of depression and anxiety.   Treatment plan signed and available on s-drive:   No, pending signature via MyChart.  [name] was sent the treatment plan signature form on 01/18/24.   Target Date: 01/17/25 Frequency: Weekly  Progress: 0 Modality: individual    Therapist will provide referrals for additional resources as appropriate.  Therapist will provide psycho-education regarding Salam's diagnosis and corresponding treatment approaches and interventions. [name], LCSW will support the patient's ability to achieve the goals identified. will employ CBT, BA, Problem-solving, Solution Focused, Mindfulness,  coping skills, & other evidenced-based practices will be used to promote progress towards healthy functioning to help manage decrease symptoms associated with his diagnosis.   Reduce overall level, frequency, and intensity of the feelings of depression, anxiety and evidenced by decreased overall symptoms from 6 to 7 days/week to 0 to 1 days/week per client report for at least 3 consecutive months. Verbally express understanding of the relationship between feelings of depression, anxiety and their impact on thinking patterns and behaviors. Verbalize an understanding of the role that distorted thinking plays in creating fears, excessive worry, and ruminations.    [name] participated in the creation of the treatment plan)   [name], LCSW

## 2024-04-09 NOTE — Therapy (Signed)
 OUTPATIENT OCCUPATIONAL THERAPY TREATMENT NOTE  Patient Name: [name] MRN: [id number] DOB:[birth year], 63 y.o., male Today's Date: 04/11/2024  PCP: Loreli MOHR MD REFERRING PROVIDER: Joane Artist RAMAN, MD   END OF SESSION:  OT End of Session - 04/11/24 0846     Visit Number 2    Number of Visits 10    Date for OT Re-Evaluation 05/02/24    Authorization Type [employer]    OT Start Time 0846    OT Stop Time 0936    OT Time Calculation (min) 50 min    Activity Tolerance Patient tolerated treatment well;No increased pain;Patient limited by fatigue;Patient limited by pain    Behavior During Therapy [facility] for tasks assessed/performed           Past Medical History:  Diagnosis Date   Allergy    SEASONAL   Duodenal ulcer with hemorrhage    X2   Gilbert's syndrome    History of kidney stones    Hypertension    no meds   IBS (irritable bowel syndrome)    Internal hemorrhoids    Nephrolithiasis    Osteoarthritis    Seasonal affective disorder (HCC)    Vitamin D deficiency    Past Surgical History:  Procedure Laterality Date   COLONOSCOPY  1998   ESOPHAGOGASTRODUODENOSCOPY  1992   EXTRACORPOREAL SHOCK WAVE LITHOTRIPSY Left 05/21/2023   Procedure: LEFT EXTRACORPOREAL SHOCK WAVE LITHOTRIPSY (ESWL);  Surgeon: Nieves Cough, MD;  Location: [facility];  Service: Urology;  Laterality: Left;   OPEN REDUCTION INTERNAL FIXATION (ORIF) DISTAL RADIAL FRACTURE Left 04/13/2020   Procedure: OPEN REDUCTION INTERNAL FIXATION (ORIF) LEFT DISTAL RADIUS FRACTURE;  Surgeon: Murrell Drivers, MD;  Location: [facility];  Service: Orthopedics;  Laterality: Left;   OPEN REDUCTION INTERNAL FIXATION (ORIF) METACARPAL Left 04/13/2020   Procedure: OPEN REDUCTION INTERNAL FIXATION (ORIF) LEFT SMALL METACARPAL;  Surgeon: Murrell Drivers, MD;  Location: [facility];  Service: Orthopedics;  Laterality: Left;   OPEN REDUCTION INTERNAL FIXATION (ORIF) PROXIMAL PHALANX Right  04/13/2020   Procedure: OPEN REDUCTION AND PINNING WITH APPLICATION OF EXTERNAL FIXATOR RIGHT PROXIMAL PHALANX;  Surgeon: Murrell Drivers, MD;  Location: [facility];  Service: Orthopedics;  Laterality: Right;   TONSILLECTOMY AND ADENOIDECTOMY  1972   Patient Active Problem List   Diagnosis Date Noted   KIDNEY STONE 06/21/2007   Allergic rhinitis 05/06/2007    ONSET DATE: chronic pain   REFERRING DIAG:  M54.2 (ICD-10-CM) - Cervicalgia  M79.602 (ICD-10-CM) - Left arm pain    THERAPY DIAG:  Muscle weakness (generalized)  Chronic left shoulder pain  Stiffness of left shoulder, not elsewhere classified  Pain in left elbow  Pain in left hand  Stiffness of right hand, not elsewhere classified  Rationale for Evaluation and Treatment: Rehabilitation  PERTINENT HISTORY: Per MD note: pt c/o neck and L arm pain x several years. Pt locates pain to the shoulder, elbow, forearm, and hand. Also notes intermittent left-sided neck pain. Has imaging from hand surgeon. Had rheumatology labs which came back negative. Denies weakness in the L UE. Some pain in the wrist and across the knuckles. Intermittent n/t.[id number] y.o. male with left trapezius pain associated with left forearm and hand pain.  These are 2 separate issues and very unlikely to be related to cervical radiculopathy.   For trapezius pain work on ergonomics at work and refer to hand therapy and potentially PT in the future.   Left forearm and hand pain due  to some component of tennis elbow and some hand DJD.  Plan for OT.  This should be quite helpful.  Recommend heat as well.   I did prescribe a course of prednisone  that Finis can start using if needed. 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  He will have intermittent swelling and pain through the left  wrist as well as some neck and shoulder stiffness and pain associated also with DJD of the neck.  He states that his neck has been flaring up lately.  He also states history of shoulder impingement and pain especially in the left arm.   PRECAUTIONS: None  RED FLAGS: None   WEIGHT BEARING RESTRICTIONS: No    SUBJECTIVE:   SUBJECTIVE STATEMENT: He states that he feels some relief since last seen approximately 3 weeks ago.  He states his lateral epicondylitis is somewhat gone now, and his bigger issue is up in the neck and shoulder area.     PAIN:  Are you having pain? Yes: NPRS scale: 2/10 at rest now in neck, 5/10 at worst Pain location: From left side of neck down the shoulder into elbow and dorsal forearm Pain description: Aching and sore Aggravating factors: Push-ups and repetitive activities Relieving factors: Rest  FALLS: Has patient fallen in last 6 months? No  PLOF: Independent  PATIENT GOALS: To improve pain and symptoms through the left arm  NEXT MD VISIT: As needed   OBJECTIVE: (All objective assessments below are from initial evaluation on: 03/17/24 unless otherwise specified.)   HAND DOMINANCE: Right   ADLs: Overall ADLs: States decreased ability to grab, hold household objects, higher level IADLs  FUNCTIONAL OUTCOME MEASURES: Eval: Patient Specific Functional Scale: 8 (work(scope), sleep, yoga)  (Higher Score  =  Better Ability for the Selected Tasks)      UPPER EXTREMITY ROM     Shoulder to Wrist AROM Right eval Left eval Lt 04/11/24  Shoulder flexion 142 136 133  Shoulder abduction 163 132   Shoulder extension 66 65   Shoulder internal rotation 55 50 40  Shoulder external rotation 85 80 82  Elbow flexion     Elbow extension     Forearm supination  78 72  Forearm pronation   66 72  Wrist flexion  71 65  Wrist extension  69 62  (Blank rows = not tested)   Hand AROM Left eval  Full Fist Ability (or Gap to Distal Palmar Crease) Full fist   Thumb Opposition  (Kapandji Scale)  Within functional limits  (Blank rows = not tested)   UPPER EXTREMITY MMT:    Eval: He has some overt pain and weakness with wrist extension, also some weakness with associated impingement pain in the left shoulder.  Specific details will be tested in upcoming sessions as time allows.  MMT Left 04/11/24  Shoulder flexion   Shoulder abduction 5/5  Shoulder adduction   Shoulder extension   Shoulder internal rotation 5/5  Shoulder external rotation 4/5 lat sh pain  Middle trapezius   Lower trapezius   Elbow flexion 5/5  Elbow extension 5/5  Forearm supination 4/5 tender to lat sh  Forearm pronation 5/5  Wrist flexion 5/5  Wrist extension 4+/5 slight lat sh and axilla  Wrist ulnar deviation   Wrist radial deviation   (Blank rows =  not tested)  HAND FUNCTION: Eval: No significant weakness in bilateral grip Grip strength Right: 100 lbs, Left: 92 lbs   COORDINATION: Eval: Some mild reported and observed gross motor coordination issues related to stiffness and weakness and pain through the lateral elbow and neck and shoulder areas of the left arm.  Details will be tested in the future as needed to help treat symptoms.  Otherwise we will rely on subjective reports   OBSERVATIONS:   Eval: He has what presents as chain tightness from the base of the left side of his neck and through the scapula running through the shoulder girdle the tricep lateral part of the elbow and forearm into the back of the hand and wrist.  This is very common to happen with a stiff shoulder and lateral epicondylitis.  It is somewhat subacute as he is managing it somewhat, but then it becomes exacerbated again.  OT will help him break this cycle   TODAY'S TREATMENT:  04/11/24: Active range of motion today yields no significant improvements except for forearm motion which is significantly improved.  Manual muscle testing shows that he still has some tenderness through the  posterior and dorsal surfaces of the forearm to the elbow through the tricep and lateral shoulder as previously indicated-though somewhat more robust and improving.   We then review his entire home exercise program and upgrade with several strengthening exercises as bolded below.  Additionally OT adds a pec stretch in the doorway as he complains of some chest and pec tightness.  He tolerates all of these well, even working the lateral epicondyle with eccentric 3 pound weight findings some fatigue but no pain.    Exercises - Standing neck/upper traps stretch  - 2-3 x daily - 3-5 reps - 15 sec hold - Sleeper Stretch  - 2-3 x daily - 5 reps - 15-20 sec hold - Triceps Stretch- Do on back of chair   - 2-3 x daily - 3-5 reps - 15 hold - Forearm Pronation Stretch  - 2-3 x daily - 3-5 reps - 15 sec hold - Wrist Prayer Stretch  - 2-3 x daily - 3-5 reps - 15 sec hold - Seated Wrist Flexion Stretch  - 2-3 x daily - 3 reps - 15 second  hold - Supine Shoulder External Rotation in Abduction  - 2-3 x daily - 4-5 x weekly - 1-2 sets - 5-10 reps - 10 sec hold - Doorway Stretches  - 4-5 x daily - 3 reps - 15 sec hold - Seated Single Shoulder External Rotation with Resistance  - 2-3 x daily - 4-5 x weekly - 1-2 sets - 10-15 reps - Chest Fly with Resistance Band with PLB  - 2-4 x daily - 4-5 x weekly - 1-2 sets - 10-15 reps - Eccentric Bicep Strength  - 2-4 x daily - 4-5 x weekly - 1-2 sets - 5-10 reps - 10 sec hold     PATIENT EDUCATION: Education details: See tx section above for details  Person educated: Patient Education method: Engineer, structural, Teach back, Handouts  Education comprehension: States and demonstrates understanding, Additional Education required    HOME EXERCISE PROGRAM: Access Code: [email or web address] URL: [email or web address] Date: 03/17/2024 Prepared by: Melvenia Ada   GOALS: Goals reviewed with patient? Yes   SHORT TERM GOALS: (STG required if POC>30 days) Target  Date: 04/04/2024  Pt will demo/state understanding of initial HEP to improve pain levels and prerequisite motion. Goal status: 04/11/2024: Met   LONG TERM GOALS: Target  Date: 05/02/2024  Pt will improve functional ability by decreased impairment per PSFS assessment from 8 to 9 or better, for better quality of life. Goal status: INITIAL  2.  Pt will improve A/ROM in left shoulder abduction from 130 to to at least 150, to have functional motion for tasks like reach and grasp.  Goal status: INITIAL  3.  Pt will improve strength in left wrist extension to at least 4+/5 MMT to have increased functional ability to carry out selfcare and higher-level homecare tasks with less difficulty. Goal status: INITIAL  4.  Pt will decrease pain at worst from 5/10 to 1/10 or better to have better sleep and occupational participation in daily roles. Goal status: INITIAL   ASSESSMENT:  CLINICAL IMPRESSION: 04/11/24: He now has a good and robust program that includes strengthening for the shoulder girdle and even lateral epicondyle areas.  He should continue his current plan, building up his strength carefully and slowly, considering his postures.  EvaL: Patient is a 63 y.o. male who was seen today for occupational therapy evaluation for pain and stiffness from the left side of the neck down through the shoulder, through the humerus into the elbow and down the forearm to the hand which decreases his functional ability, his quality of life, etc.  The patient will benefit from outpatient occupational therapy to decrease symptoms, improve functional upper extremity use, and increase quality of life.    PLAN:  OT FREQUENCY: 1-2x/week  OT DURATION: 6 weeks through 05/02/2024 and up to 10 total visits as needed   PLANNED INTERVENTIONS: 97535 self care/ADL training, 02889 therapeutic exercise, 97530 therapeutic activity, 97112 neuromuscular re-education, 97140 manual therapy, 97035 ultrasound, 97032 electrical  stimulation (manual), 97760 Orthotic Initial, 97763 Orthotic/Prosthetic subsequent, compression bandaging, Dry needling, energy conservation, coping strategies training, and patient/family education  RECOMMENDED OTHER SERVICES: none now    CONSULTED AND AGREED WITH PLAN OF CARE: Patient  PLAN FOR NEXT SESSION:   Check new strengthening, check symptoms, perhaps find a different way to do chest strengthening as it was difficult with his forearm today with therapy band.   Melvenia Ada, OTR/L, CHT  04/11/2024, 11:21 AM

## 2024-04-11 ENCOUNTER — Encounter: Payer: Self-pay | Admitting: Rehabilitative and Restorative Service Providers"

## 2024-04-11 ENCOUNTER — Ambulatory Visit: Admitting: Rehabilitative and Restorative Service Providers"

## 2024-04-11 DIAGNOSIS — M6281 Muscle weakness (generalized): Secondary | ICD-10-CM | POA: Diagnosis not present

## 2024-04-11 DIAGNOSIS — M79642 Pain in left hand: Secondary | ICD-10-CM

## 2024-04-11 DIAGNOSIS — M25641 Stiffness of right hand, not elsewhere classified: Secondary | ICD-10-CM

## 2024-04-11 DIAGNOSIS — M25612 Stiffness of left shoulder, not elsewhere classified: Secondary | ICD-10-CM | POA: Diagnosis not present

## 2024-04-11 DIAGNOSIS — M25522 Pain in left elbow: Secondary | ICD-10-CM

## 2024-04-11 DIAGNOSIS — G8929 Other chronic pain: Secondary | ICD-10-CM

## 2024-04-11 DIAGNOSIS — M25512 Pain in left shoulder: Secondary | ICD-10-CM | POA: Diagnosis not present

## 2024-04-18 ENCOUNTER — Other Ambulatory Visit: Payer: Self-pay | Admitting: Internal Medicine

## 2024-04-18 ENCOUNTER — Other Ambulatory Visit (HOSPITAL_COMMUNITY): Payer: Self-pay

## 2024-04-18 DIAGNOSIS — K649 Unspecified hemorrhoids: Secondary | ICD-10-CM

## 2024-04-18 MED ORDER — HYDROCORTISONE (PERIANAL) 2.5 % EX CREA
1.0000 | TOPICAL_CREAM | Freq: Two times a day (BID) | CUTANEOUS | 3 refills | Status: AC | PRN
Start: 2024-04-18 — End: ?
  Filled 2024-04-18: qty 30, 10d supply, fill #0

## 2024-04-21 NOTE — Therapy (Signed)
 OUTPATIENT OCCUPATIONAL THERAPY TREATMENT NOTE  Patient Name: [name] MRN: [id number] DOB:30-Jul-1961, 63 y.o., male Today's Date: 04/22/2024  PCP: Loreli MOHR MD REFERRING PROVIDER: Joane Artist RAMAN, MD   END OF SESSION:  OT End of Session - 04/22/24 1023     Visit Number 3    Number of Visits 10    Date for OT Re-Evaluation 05/02/24    Authorization Type [employer]    OT Start Time 1023    OT Stop Time 1056    OT Time Calculation (min) 33 min    Activity Tolerance Patient tolerated treatment well;No increased pain;Patient limited by fatigue;Patient limited by pain    Behavior During Therapy [facility] for tasks assessed/performed            Past Medical History:  Diagnosis Date   Allergy    SEASONAL   Duodenal ulcer with hemorrhage    X2   Gilbert's syndrome    History of kidney stones    Hypertension    no meds   IBS (irritable bowel syndrome)    Internal hemorrhoids    Nephrolithiasis    Osteoarthritis    Seasonal affective disorder (HCC)    Vitamin D deficiency    Past Surgical History:  Procedure Laterality Date   COLONOSCOPY  1998   ESOPHAGOGASTRODUODENOSCOPY  1992   EXTRACORPOREAL SHOCK WAVE LITHOTRIPSY Left 05/21/2023   Procedure: LEFT EXTRACORPOREAL SHOCK WAVE LITHOTRIPSY (ESWL);  Surgeon: Nieves Cough, MD;  Location: [facility];  Service: Urology;  Laterality: Left;   OPEN REDUCTION INTERNAL FIXATION (ORIF) DISTAL RADIAL FRACTURE Left 04/13/2020   Procedure: OPEN REDUCTION INTERNAL FIXATION (ORIF) LEFT DISTAL RADIUS FRACTURE;  Surgeon: Murrell Drivers, MD;  Location: [facility];  Service: Orthopedics;  Laterality: Left;   OPEN REDUCTION INTERNAL FIXATION (ORIF) METACARPAL Left 04/13/2020   Procedure: OPEN REDUCTION INTERNAL FIXATION (ORIF) LEFT SMALL METACARPAL;  Surgeon: Murrell Drivers, MD;  Location: [facility];  Service: Orthopedics;  Laterality: Left;   OPEN REDUCTION INTERNAL FIXATION (ORIF) PROXIMAL PHALANX Right  04/13/2020   Procedure: OPEN REDUCTION AND PINNING WITH APPLICATION OF EXTERNAL FIXATOR RIGHT PROXIMAL PHALANX;  Surgeon: Murrell Drivers, MD;  Location: [facility];  Service: Orthopedics;  Laterality: Right;   TONSILLECTOMY AND ADENOIDECTOMY  1972   Patient Active Problem List   Diagnosis Date Noted   KIDNEY STONE 06/21/2007   Allergic rhinitis 05/06/2007    ONSET DATE: chronic pain   REFERRING DIAG:  M54.2 (ICD-10-CM) - Cervicalgia  M79.602 (ICD-10-CM) - Left arm pain    THERAPY DIAG:  Muscle weakness (generalized)  Chronic left shoulder pain  Stiffness of left shoulder, not elsewhere classified  Pain in left elbow  Pain in left hand  Rationale for Evaluation and Treatment: Rehabilitation  PERTINENT HISTORY: Per MD note: pt c/o neck and L arm pain x several years. Pt locates pain to the shoulder, elbow, forearm, and hand. Also notes intermittent left-sided neck pain. Has imaging from hand surgeon. Had rheumatology labs which came back negative. Denies weakness in the L UE. Some pain in the wrist and across the knuckles. Intermittent n/t.[id number] y.o. male with left trapezius pain associated with left forearm and hand pain.  These are 2 separate issues and very unlikely to be related to cervical radiculopathy.   For trapezius pain work on ergonomics at work and refer to hand therapy and potentially PT in the future.   Left forearm and hand pain due to some component of tennis elbow and  some hand DJD.  Plan for OT.  This should be quite helpful.  Recommend heat as well.   I did prescribe a course of prednisone  that Donald can start using if needed. 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  He will have intermittent swelling and pain through the left wrist as well as some neck and shoulder stiffness and  pain associated also with DJD of the neck.  He states that his neck has been flaring up lately.  He also states history of shoulder impingement and pain especially in the left arm.   PRECAUTIONS: None  RED FLAGS: None   WEIGHT BEARING RESTRICTIONS: No    SUBJECTIVE:   SUBJECTIVE STATEMENT: He states  doing well, adjusting sleep positions.     PAIN:  Are you having pain? Yes: NPRS scale:  1/10 at rest now in neck, 5/10 at worst Pain location: From left side of neck down the shoulder into elbow and dorsal forearm Pain description: Aching and sore Aggravating factors: Push-ups and repetitive activities Relieving factors: Rest  FALLS: Has patient fallen in last 6 months? No  PLOF: Independent  PATIENT GOALS: To improve pain and symptoms through the left arm  NEXT MD VISIT: As needed   OBJECTIVE: (All objective assessments below are from initial evaluation on: 03/17/24 unless otherwise specified.)   HAND DOMINANCE: Right   ADLs: Overall ADLs: States decreased ability to grab, hold household objects, higher level IADLs  FUNCTIONAL OUTCOME MEASURES: Eval: Patient Specific Functional Scale: 8 (work(scope), sleep, yoga)  (Higher Score  =  Better Ability for the Selected Tasks)      UPPER EXTREMITY ROM     Shoulder to Wrist AROM Right eval Left eval Lt 04/11/24 Lt 04/22/24  Shoulder flexion 142 136 133 130  Shoulder abduction 163 132    Shoulder extension 66 65    Shoulder internal rotation 55 50 40 48  Shoulder external rotation 85 80 82 82  Elbow flexion      Elbow extension      Forearm supination  78 72   Forearm pronation   66 72   Wrist flexion  71 65   Wrist extension  69 62   (Blank rows = not tested)   Hand AROM Left eval  Full Fist Ability (or Gap to Distal Palmar Crease) Full fist  Thumb Opposition  (Kapandji Scale)  Within functional limits  (Blank rows = not tested)   UPPER EXTREMITY MMT:    Eval: He has some overt pain and weakness with  wrist extension, also some weakness with associated impingement pain in the left shoulder.  Specific details will be tested in upcoming sessions as time allows.  MMT Left 04/11/24 Lt 04/22/24  Shoulder flexion    Shoulder abduction 5/5   Shoulder adduction    Shoulder extension    Shoulder internal rotation 5/5   Shoulder external rotation 4/5 lat sh pain 4+/5 slight sh irritation  Middle trapezius    Lower trapezius    Elbow flexion 5/5   Elbow extension 5/5   Forearm supination 4/5 tender to lat sh 5/5 wrist slight tender  Forearm pronation 5/5   Wrist flexion 5/5   Wrist extension 4+/5 slight lat sh and axilla 5/5 no issues to elbow   Wrist ulnar deviation  Wrist radial deviation    (Blank rows = not tested)  HAND FUNCTION: 04/22/24: Grip Lt: 97#   Eval: No significant weakness in bilateral grip Grip strength Right: 100 lbs, Left: 92 lbs   COORDINATION: Eval: Some mild reported and observed gross motor coordination issues related to stiffness and weakness and pain through the lateral elbow and neck and shoulder areas of the left arm.  Details will be tested in the future as needed to help treat symptoms.  Otherwise we will rely on subjective reports   OBSERVATIONS:   Eval: He has what presents as chain tightness from the base of the left side of his neck and through the scapula running through the shoulder girdle the tricep lateral part of the elbow and forearm into the back of the hand and wrist.  This is very common to happen with a stiff shoulder and lateral epicondylitis.  It is somewhat subacute as he is managing it somewhat, but then it becomes exacerbated again.  OT will help him break this cycle   TODAY'S TREATMENT:  04/22/24: He performs active range of motion for exercise as well as new measures which shows maintenance of his current motion with no significant improvements, but his symptoms have been better per discussion.  He feels like we are targeting all of the  areas that he needs.  His grip strength is up, and with a manual muscle test strength is also up and less painful to his shoulder girdle and no pain in the lateral elbow.  We reviewed strengthening which does require some tweaking.  OT also adds a fridge door stretch and modifies the way we are doing chest strengthening to target the fibers that are more sore.  He states these are well-tolerated, no pain, understands all directions and will leave to go on vacation next week.  We discussed that he will do his best for 2 weeks and return for a progress check    Exercises reviewed and performed today: - Standing neck/upper traps stretch  - 2-3 x daily - 3-5 reps - 15 sec hold - Sleeper Stretch  - 2-3 x daily - 5 reps - 15-20 sec hold - Triceps Stretch- Do on back of chair   - 2-3 x daily - 3-5 reps - 15 hold - Forearm Pronation Stretch  - 2-3 x daily - 3-5 reps - 15 sec hold - Wrist Prayer Stretch  - 2-3 x daily - 3-5 reps - 15 sec hold - Seated Wrist Flexion Stretch  - 2-3 x daily - 3 reps - 15 second  hold - Supine Shoulder External Rotation in Abduction  - 2-3 x daily - 4-5 x weekly - 1-2 sets - 5-10 reps - 10 sec hold - Doorway Stretches  - 4-5 x daily - 3 reps - 15 sec hold - Seated Single Shoulder External Rotation with Resistance  - 2-3 x daily - 4-5 x weekly - 1-2 sets - 10-15 reps - Chest Fly with Resistance Band with PLB  - 2-4 x daily - 4-5 x weekly - 1-2 sets - 10-15 reps - Eccentric Bicep Strength  - 2-4 x daily - 4-5 x weekly - 1-2 sets - 5-10 reps - 10 sec hold - Fridge Door Stretch  - 4 x daily - 3-5 reps - 15 sec hold   PATIENT EDUCATION: Education details: See tx section above for details  Person educated: Patient Education method: Verbal Instruction, Teach back, Handouts  Education comprehension: States and demonstrates understanding, Additional  Education required    HOME EXERCISE PROGRAM: Access Code: [email or web address] URL: [email or web address] Date:  03/17/2024 Prepared by: Melvenia Ada   GOALS: Goals reviewed with patient? Yes   SHORT TERM GOALS: (STG required if POC>30 days) Target Date: 04/04/2024  Pt will demo/state understanding of initial HEP to improve pain levels and prerequisite motion. Goal status: 04/11/2024: Met   LONG TERM GOALS: Target Date: 05/02/2024  Pt will improve functional ability by decreased impairment per PSFS assessment from 8 to 9 or better, for better quality of life. Goal status: INITIAL  2.  Pt will improve A/ROM in left shoulder abduction from 130 to to at least 150, to have functional motion for tasks like reach and grasp.  Goal status: INITIAL  3.  Pt will improve strength in left wrist extension to at least 4+/5 MMT to have increased functional ability to carry out selfcare and higher-level homecare tasks with less difficulty. Goal status: INITIAL  4.  Pt will decrease pain at worst from 5/10 to 1/10 or better to have better sleep and occupational participation in daily roles. Goal status: INITIAL   ASSESSMENT:  CLINICAL IMPRESSION: 04/22/24: Doing much better in terms of reports as well as strength.  Hopefully he has no exacerbations over the next 2 weeks  04/11/24: He now has a good and robust program that includes strengthening for the shoulder girdle and even lateral epicondyle areas.  He should continue his current plan, building up his strength carefully and slowly, considering his postures.  EvaL: Patient is a 63 y.o. male who was seen today for occupational therapy evaluation for pain and stiffness from the left side of the neck down through the shoulder, through the humerus into the elbow and down the forearm to the hand which decreases his functional ability, his quality of life, etc.  The patient will benefit from outpatient occupational therapy to decrease symptoms, improve functional upper extremity use, and increase quality of life.    PLAN:  OT FREQUENCY: 1-2x/week  OT  DURATION: 6 weeks through 05/02/2024 and up to 10 total visits as needed   PLANNED INTERVENTIONS: 97535 self care/ADL training, 02889 therapeutic exercise, 97530 therapeutic activity, 97112 neuromuscular re-education, 97140 manual therapy, 97035 ultrasound, 97032 electrical stimulation (manual), 97760 Orthotic Initial, [id number] Orthotic/Prosthetic subsequent, compression bandaging, Dry needling, energy conservation, coping strategies training, and patient/family education  RECOMMENDED OTHER SERVICES: none now    CONSULTED AND AGREED WITH PLAN OF CARE: Patient  PLAN FOR NEXT SESSION:   Progress note to determine the need for additional therapy.   Melvenia Ada, OTR/L, CHT  04/22/2024, 10:59 AM

## 2024-04-22 ENCOUNTER — Ambulatory Visit (INDEPENDENT_AMBULATORY_CARE_PROVIDER_SITE_OTHER): Admitting: Psychology

## 2024-04-22 ENCOUNTER — Ambulatory Visit: Admitting: Rehabilitative and Restorative Service Providers"

## 2024-04-22 ENCOUNTER — Other Ambulatory Visit (HOSPITAL_COMMUNITY): Payer: Self-pay

## 2024-04-22 ENCOUNTER — Encounter: Payer: Self-pay | Admitting: Rehabilitative and Restorative Service Providers"

## 2024-04-22 DIAGNOSIS — F3289 Other specified depressive episodes: Secondary | ICD-10-CM

## 2024-04-22 DIAGNOSIS — M25512 Pain in left shoulder: Secondary | ICD-10-CM | POA: Diagnosis not present

## 2024-04-22 DIAGNOSIS — G8929 Other chronic pain: Secondary | ICD-10-CM

## 2024-04-22 DIAGNOSIS — M25612 Stiffness of left shoulder, not elsewhere classified: Secondary | ICD-10-CM

## 2024-04-22 DIAGNOSIS — M6281 Muscle weakness (generalized): Secondary | ICD-10-CM | POA: Diagnosis not present

## 2024-04-22 DIAGNOSIS — M25522 Pain in left elbow: Secondary | ICD-10-CM

## 2024-04-22 DIAGNOSIS — M79642 Pain in left hand: Secondary | ICD-10-CM

## 2024-04-22 MED ORDER — SERTRALINE HCL 25 MG PO TABS
25.0000 mg | ORAL_TABLET | Freq: Every day | ORAL | 1 refills | Status: AC
  Filled 2024-04-22: qty 30, 30d supply, fill #0

## 2024-04-22 MED ORDER — SERTRALINE HCL 50 MG PO TABS
50.0000 mg | ORAL_TABLET | Freq: Every morning | ORAL | 0 refills | Status: AC
  Filled 2024-04-22: qty 90, 90d supply, fill #0

## 2024-04-22 MED ORDER — SERTRALINE HCL 25 MG PO TABS
25.0000 mg | ORAL_TABLET | Freq: Every day | ORAL | 0 refills | Status: AC
  Filled 2024-04-22: qty 30, 30d supply, fill #0

## 2024-04-22 MED ORDER — MIRTAZAPINE 15 MG PO TABS
15.0000 mg | ORAL_TABLET | Freq: Every day | ORAL | 1 refills | Status: AC
  Filled 2024-04-22: qty 30, 30d supply, fill #0
  Filled 2024-05-19: qty 30, 30d supply, fill #1

## 2024-04-22 MED ORDER — MIRTAZAPINE 15 MG PO TABS
15.0000 mg | ORAL_TABLET | Freq: Every day | ORAL | 0 refills | Status: AC
  Filled 2024-04-22: qty 30, 30d supply, fill #0

## 2024-04-22 NOTE — Progress Notes (Signed)
 [facility] Counselor/Therapist Progress Note  Patient ID: RAYDEN DOCK, MRN: [id number]   Date: 04/22/24  Time Spent: 9:01 am - 9:30  am : 29 Minutes   Treatment Type: Individual Therapy.  Reported Symptoms: Depression and anxiety.   Mental Status Exam: Appearance:  Neat and Well Groomed     Behavior: Appropriate  Motor: Normal  Speech/Language:  Clear and Coherent  Affect: Congruent  Mood: normal  Thought process: normal  Thought content:   WNL  Sensory/Perceptual disturbances:   WNL  Orientation: oriented to person, place, time/date, and situation  Attention: Good  Concentration: Good  Memory: WNL  Fund of knowledge:  Good  Insight:   Good  Judgment:  Good  Impulse Control: Good   Risk Assessment: Danger to Self:  No Self-injurious Behavior: No Danger to Others: No Duty to Warn:no Physical Aggression / Violence:No  Access to Firearms a concern: No  Gang Involvement:No   Subjective:   [name] participated from his home via video and consented to treatment. Therapist participated from home office. Welden reviewed the events of the past week.Dominque noted being up and down due to allergies, a cold, being busy at work, and patient stressors. He noted more system related stressors. He noted recently reducing the flow of new patients  which he noted has been helpful overall.  Therapist praised Jarryd for his effort to identify and build a routine prior to retirement to aid in this transition. He continues to experience sleep issues and noted that he will further discuss this with his prescribed, Dr. Chipper. Continued barriers include meeting with a sports doctor and is participating in occupational therapy. He is currently taking 50 mg every day, down from 100 mg, due to experiencing night sweats. 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 He noted volunteering at the local folk festival, spending time with family from out of town, and noted his son's recent  visit to town. He noted consideration of retirement but noted that he will be delaying this, at least, a year, to develop a day-to-day routine and get more engaged in personal interests and hobbies prior to retirement. He noted assessing this year by year and seeing if work related stressors improve with increased staffing. We worked on reviewing his coping skills including engaging in self-care, setting and maintaining boundaries at home and work, building a daily rhythm and routine, identifying areas of control and lack of control, seizing opportunities for socialization and engagement in hobbies. Therapist encouraged Chanse to continue meeting with his prescriber and maintain his medication regimen. Therapist encouraged Hassel to contact therapist, for check-ins or ad-hoc appointments. Therapist encouraged Alexys to schedule enjoyable activities, at a smaller scale, including scheduling, identifying barriers, and working towards manageable goals. Kelcey has the office contact information and is receptive to reaching out when the need arise. Therapist validated  Antony's feelings and experience and provided supportive therapy.   Interventions: CBT  Diagnosis:  Other depression  Psychiatric Treatment: Yes , See chart.    Treatment Plan:  Client Abilities/Strengths Arvon is intelligent, forthcoming, and motivated for change.   Support System: Family and friends.   Client Treatment Preferences OPT  Client Statement of Needs Jayel would like to develop coping mechanism regarding uncertainty, identify areas of control and lack of control, reduce rumination, work on prioritizing tasks and managing worry related to, work on acceptance, manage his worry about how other's perceive him (work limitations, not seeing patient's as often as they would like),  increase engagement in enjoyable activities (socializing and self-care), managing distress and uncertainty.    Treatment Level Weekly  Symptoms  Anxiety:  Feeling anxious, difficulty managing worry, worrying too much about different things, trouble relaxing, irritability, and feeling afraid something awful might happen, rumination.  (Status: maintained) Depression: loss of interest, feeling down, poor sleep (middle insomnia,  rumination, physical pain from repetitive tasks), feeling bad about self, difficulty concentrating.  (Status: maintained)  Goals:   Derril experiences symptoms of depression and anxiety.   Treatment plan signed and available on s-drive:   No, pending signature via MyChart.  ELLISON RIETH was sent the treatment plan signature form on 01/18/24.   Target Date: 01/17/25 Frequency: Weekly  Progress: 0 Modality: individual    Therapist will provide referrals for additional resources as appropriate.  Therapist will provide psycho-education regarding Iren's diagnosis and corresponding treatment approaches and interventions. [name], LCSW will support the patient's ability to achieve the goals identified. will employ CBT, BA, Problem-solving, Solution Focused, Mindfulness,  coping skills, & other evidenced-based practices will be used to promote progress towards healthy functioning to help manage decrease symptoms associated with his diagnosis.   Reduce overall level, frequency, and intensity of the feelings of depression, anxiety and evidenced by decreased overall symptoms from 6 to 7 days/week to 0 to 1 days/week per client report for at least 3 consecutive months. Verbally express understanding of the relationship between feelings of depression, anxiety and their impact on thinking patterns and behaviors. Verbalize an understanding of the role that distorted thinking plays in creating fears, excessive worry, and ruminations.    [name] participated in the creation of the treatment plan)   [name], LCSW

## 2024-04-23 ENCOUNTER — Other Ambulatory Visit (HOSPITAL_COMMUNITY): Payer: Self-pay

## 2024-04-30 NOTE — Therapy (Signed)
 OUTPATIENT OCCUPATIONAL THERAPY TREATMENT & DISCHARGE NOTE  Patient Name: [name] MRN: [id number] DOB:05-22-1961, 63 y.o., male Today's Date: 05/05/2024  PCP: Donald MOHR MD REFERRING PROVIDER: Joane Artist RAMAN, MD                   OCCUPATIONAL THERAPY DISCHARGE SUMMARY  Visits from Start of Care: 4  Current functional level related to goals / functional outcomes:  CLINICAL IMPRESSION: 05/05/24: Patient states feeling between 40 and 50% better today than he did at the start of care.  His hand and wrist discomfort is all but gone, and his lateral epicondylitis is not currently bothering him.  He still has some lingering symptoms around the neck and shoulder for which he continues to do exercises and practice good self-management.  Not all of his long-term goals were met, but this is partially because he has a heightened awareness of his symptoms.  He does state feeling better and is happy to discharge today with the plan of care that has been given to him.  He is welcome to come in the future with a new order for any lingering or exacerbated symptoms.   PLAN:  OT FREQUENCY: 1 last visit to cover today's last visit  OT DURATION: 1 additional week, from 05/02/2024 - 05/05/24  and up to 4 total visits    Education / Equipment: Pt has all needed materials and education. Pt understands how to continue on with self-management. See tx notes for more details.   Patient agrees to discharge due to max benefits received from outpatient occupational therapy / hand therapy at this time.   [name], OTR/L, CHT 05/05/2024                END OF SESSION:  OT End of Session - 05/05/24 0848     Visit Number 4    Number of Visits 10    Date for Recertification  05/02/24    Authorization Type [employer]    OT Start Time 0848    OT Stop Time 0936    OT Time Calculation (min) 48 min    Activity Tolerance Patient tolerated treatment well;No increased pain;Patient  limited by fatigue    Behavior During Therapy [facility] for tasks assessed/performed             Past Medical History:  Diagnosis Date   Allergy    SEASONAL   Duodenal ulcer with hemorrhage    X2   Gilbert's syndrome    History of kidney stones    Hypertension    no meds   IBS (irritable bowel syndrome)    Internal hemorrhoids    Nephrolithiasis    Osteoarthritis    Seasonal affective disorder    Vitamin D deficiency    Past Surgical History:  Procedure Laterality Date   COLONOSCOPY  1998   ESOPHAGOGASTRODUODENOSCOPY  1992   EXTRACORPOREAL SHOCK WAVE LITHOTRIPSY Left 05/21/2023   Procedure: LEFT EXTRACORPOREAL SHOCK WAVE LITHOTRIPSY (ESWL);  Surgeon: Donald Cough, MD;  Location: [facility];  Service: Urology;  Laterality: Left;   OPEN REDUCTION INTERNAL FIXATION (ORIF) DISTAL RADIAL FRACTURE Left 04/13/2020   Procedure: OPEN REDUCTION INTERNAL FIXATION (ORIF) LEFT DISTAL RADIUS FRACTURE;  Surgeon: Donald Drivers, MD;  Location: [facility];  Service: Orthopedics;  Laterality: Left;   OPEN REDUCTION INTERNAL FIXATION (ORIF) METACARPAL Left 04/13/2020   Procedure: OPEN REDUCTION INTERNAL FIXATION (ORIF) LEFT SMALL METACARPAL;  Surgeon: Donald Drivers, MD;  Location:  SUR[facility]ERY [facility];  Service: Orthopedics;  Laterality: Left;   OPEN REDUCTION INTERNAL FIXATION (ORIF) PROXIMAL PHALANX Right 04/13/2020   Procedure: OPEN REDUCTION AND PINNING WITH APPLICATION OF EXTERNAL FIXATOR RIGHT PROXIMAL PHALANX;  Surgeon: Donald Drivers, MD;  Location: [facility];  Service: Orthopedics;  Laterality: Right;   TONSILLECTOMY AND ADENOIDECTOMY  1972   Patient Active Problem List   Diagnosis Date Noted   KIDNEY STONE 06/21/2007   Allergic rhinitis 05/06/2007    ONSET DATE: chronic pain   REFERRING DIAG:  M54.2 (ICD-10-CM) - Cervicalgia  M79.602 (ICD-10-CM) - Left arm pain    THERAPY DIAG:  Stiffness of left shoulder, not elsewhere classified  - Plan: Ot plan of care cert/re-cert  Muscle weakness (generalized) - Plan: Ot plan of care cert/re-cert  Chronic left shoulder pain - Plan: Ot plan of care cert/re-cert  Pain in left elbow - Plan: Ot plan of care cert/re-cert  Pain in left hand - Plan: Ot plan of care cert/re-cert  Stiffness of right hand, not elsewhere classified - Plan: Ot plan of care cert/re-cert  Rationale for Evaluation and Treatment: Rehabilitation  PERTINENT HISTORY: Per MD note: pt c/o neck and L arm pain x several years. Pt locates pain to the shoulder, elbow, forearm, and hand. Also notes intermittent left-sided neck pain. Has imaging from hand surgeon. Had rheumatology labs which came back negative. Denies weakness in the L UE. Some pain in the wrist and across the knuckles. Intermittent n/t.[id number] y.o. male with left trapezius pain associated with left forearm and hand pain.  These are 2 separate issues and very unlikely to be related to cervical radiculopathy.   For trapezius pain work on ergonomics at work and refer to hand therapy and potentially PT in the future.   Left forearm and hand pain due to some component of tennis elbow and some hand DJD.  Plan for OT.  This should be quite helpful.  Recommend heat as well.   I did prescribe a course of prednisone  that [name] can start using if needed. 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  He will have intermittent swelling and pain through the left wrist as well as some neck and shoulder stiffness and pain associated also with DJD of the neck.  He states that his neck has been flaring up lately.  He also states history of shoulder impingement and pain especially in the left arm.   PRECAUTIONS: None  RED FLAGS: None   WEIGHT BEARING RESTRICTIONS: No    SUBJECTIVE:   SUBJECTIVE  STATEMENT: He returns for a 2 week check,  states he had a good vacation last week, did have some increased shoulder issues with cycling, but overall his neck and his arm is feeling much better now.  He states feeling 40 to 50% better since the start of care, and has no significant pain now.   PAIN:  Are you having pain? Yes: NPRS scale:  1/10 at rest now in neck,  4/10 at worst in past week Pain location: From left side of neck down the shoulder into elbow and dorsal forearm Pain description: Aching and sore Aggravating factors: Push-ups and repetitive activities Relieving factors: Rest  FALLS: Has patient fallen in last 6 months? No  PLOF: Independent  PATIENT GOALS: To improve pain and symptoms through the left arm  NEXT MD VISIT: As needed   OBJECTIVE: (All objective assessments below are from initial evaluation on: 03/17/24 unless otherwise specified.)   HAND DOMINANCE: Right   ADLs: Overall ADLs: States decreased ability to grab, hold household objects, higher level IADLs  FUNCTIONAL OUTCOME MEASURES: 05/05/24: PSFS: 7 (this is technically not as good as when first measured, likely due to his increased awareness of his symptoms, which sometimes occurs.  He does state that he is back to yoga and all activities now and not having a significant issue at work.)   Eval: Patient Specific Functional Scale: 8 (work(scope), sleep, yoga)  (Higher Score  =  Better Ability for the Selected Tasks)      UPPER EXTREMITY ROM     Shoulder to Wrist AROM Right eval Left eval Lt 04/11/24 Lt 04/22/24 Lt 05/05/24  Shoulder flexion 142 136 133 130 136  Shoulder abduction 163 132   136  Shoulder extension 66 65   62  Shoulder internal rotation 55 50 40 48 49  Shoulder external rotation 85 80 82 82 85  Elbow flexion       Elbow extension       Forearm supination  78 72  82  Forearm pronation   66 72  72  Wrist flexion  71 65  73  Wrist extension  69 62  63  (Blank rows = not  tested)   Hand AROM Left eval  Full Fist Ability (or Gap to Distal Palmar Crease) Full fist  Thumb Opposition  (Kapandji Scale)  Within functional limits  (Blank rows = not tested)   UPPER EXTREMITY MMT:    Eval: He has some overt pain and weakness with wrist extension, also some weakness with associated impingement pain in the left shoulder.  Specific details will be tested in upcoming sessions as time allows.  MMT Left 04/11/24 Lt 04/22/24 Lt 05/05/24  Shoulder flexion     Shoulder abduction 5/5    Shoulder adduction     Shoulder extension     Shoulder internal rotation 5/5    Shoulder external rotation 4/5 lat sh pain 4+/5 slight sh irritation 5/5  Middle trapezius     Lower trapezius     Elbow flexion 5/5    Elbow extension 5/5    Forearm supination 4/5 tender to lat sh 5/5 wrist slight tender 5/5  Forearm pronation 5/5    Wrist flexion 5/5    Wrist extension 4+/5 slight lat sh and axilla 5/5 no issues to elbow    Wrist ulnar deviation     Wrist radial deviation     (Blank rows = not tested)  HAND FUNCTION: 04/22/24: Grip Lt: 97#   Eval: No significant weakness in bilateral grip Grip strength Right: 100 lbs, Left: 92 lbs      TODAY'S TREATMENT:  05/05/24: He starts with active range of motion for exercise as well as new measures which shows some maintained improvements in the arm and shoulder but also some lingering stiffness.  For that, we do review his whole home exercise program also adding the idea of shoulder flexion stretches supple wall as well as shoulder abduction stretches supple wall.  OT takes a video with him to help him understand this and remember this.  We also reviewed the exercises as listed below and additionally focusing on new complaints of tightness around the serratus anterior/LAT muscle.  OT adds on new serratus anterior punches which he tolerates with 9 pounds today also performing push-up plus  and weightbearing on elbows and hips with scapular  pops.  He does well with these with no additional pain or complaints.   We reviewed his goals for therapy and his functional ability which he does rate mildly decreased despite stating feeling better.  We agree that this is due to his heightened sense of awareness about his joint issues and postures.  He states understanding all directions, overall feeling better, and ready to discharge therapy today.  He does know that he can return with a new order as needed in the future for any exacerbations or new issues.   Other exercises reviewed today: - Standing neck/upper traps stretch  - 2-3 x daily - 3-5 reps - 15 sec hold - Sleeper Stretch  - 2-3 x daily - 5 reps - 15-20 sec hold - Triceps Stretch- Do on back of chair   - 2-3 x daily - 3-5 reps - 15 hold - Forearm Pronation Stretch  - 2-3 x daily - 3-5 reps - 15 sec hold - Wrist Prayer Stretch  - 2-3 x daily - 3-5 reps - 15 sec hold - Seated Wrist Flexion Stretch  - 2-3 x daily - 3 reps - 15 second  hold - Supine Shoulder External Rotation in Abduction  - 2-3 x daily - 4-5 x weekly - 1-2 sets - 5-10 reps - 10 sec hold - Doorway Stretches  - 4-5 x daily - 3 reps - 15 sec hold - Seated Single Shoulder External Rotation with Resistance  - 2-3 x daily - 4-5 x weekly - 1-2 sets - 10-15 reps - Chest Fly with Resistance Band with PLB  - 2-4 x daily - 4-5 x weekly - 1-2 sets - 10-15 reps - Eccentric Bicep Strength  - 2-4 x daily - 4-5 x weekly - 1-2 sets - 5-10 reps - 10 sec hold - Fridge Door Stretch  - 4 x daily - 3-5 reps - 15 sec hold   PATIENT EDUCATION: Education details: See tx section above for details  Person educated: Patient Education method: Verbal Instruction, Teach back, Handouts  Education comprehension: States and demonstrates understanding   HOME EXERCISE PROGRAM: Access Code: [email or web address] URL: [email or web address] Date: 03/17/2024 Prepared by: [name]   GOALS: Goals reviewed with patient? Yes   SHORT  TERM GOALS: (STG required if POC>30 days) Target Date: 04/04/2024  Pt will demo/state understanding of initial HEP to improve pain levels and prerequisite motion. Goal status: 04/11/2024: Met   LONG TERM GOALS: Target Date: 05/05/2024  Pt will improve functional ability by decreased impairment per PSFS assessment from 8 to 9 or better, for better quality of life. Goal status: 05/05/24: rated 7, but has heightened awareness, and does state feeling between 40-50% better today  2.  Pt will improve A/ROM in left shoulder abduction from 130 to to at least 150, to have functional motion for tasks like reach and grasp.  Goal status: 05/05/24: improved to 136  3.  Pt will improve strength in left wrist extension to at least 4+/5 MMT to have increased functional ability to carry out selfcare and higher-level homecare tasks with less difficulty. Goal status: 05/05/24: MET  4.  Pt will decrease pain at worst from 5/10 to 1/10 or better to have better sleep and occupational participation in daily roles. Goal status: 05/05/24: down to 4/10, but infrequent    ASSESSMENT:  CLINICAL IMPRESSION: 05/05/24: Patient states feeling between 40 and 50% better today than he did at the start of care.  His hand and wrist discomfort is all but gone, and his lateral epicondylitis is not currently bothering him.  He still has some lingering symptoms around the neck and shoulder for which he continues to do exercises and practice good self-management.  Not all of his long-term goals were met, but this is partially because he has a heightened awareness of his symptoms.  He does state feeling better and is happy to discharge today with the plan of care that has been given to him.  He is welcome to come in the future with a new order for any lingering or exacerbated symptoms.   PLAN:  OT FREQUENCY: 1 last visit  OT DURATION: 1 additional week, from 05/02/2024 - 05/05/24  and up to 4 total visits    PLANNED INTERVENTIONS:  97535 self care/ADL training, 02889 therapeutic exercise, 97530 therapeutic activity, 97112 neuromuscular re-education, 97140 manual therapy, 97035 ultrasound, 97032 electrical stimulation (manual), 97760 Orthotic Initial, [id number] Orthotic/Prosthetic subsequent, compression bandaging, Dry needling, energy conservation, coping strategies training, and patient/family education  RECOMMENDED OTHER SERVICES: none now    CONSULTED AND AGREED WITH PLAN OF CARE: Patient  PLAN FOR NEXT SESSION:   N/A, D/C   [name], OTR/L, CHT  05/05/2024, 3:16 PM

## 2024-05-05 ENCOUNTER — Encounter: Admitting: Rehabilitative and Restorative Service Providers"

## 2024-05-05 ENCOUNTER — Ambulatory Visit: Admitting: Rehabilitative and Restorative Service Providers"

## 2024-05-05 ENCOUNTER — Encounter: Payer: Self-pay | Admitting: Rehabilitative and Restorative Service Providers"

## 2024-05-05 DIAGNOSIS — M25641 Stiffness of right hand, not elsewhere classified: Secondary | ICD-10-CM

## 2024-05-05 DIAGNOSIS — M25522 Pain in left elbow: Secondary | ICD-10-CM

## 2024-05-05 DIAGNOSIS — M25612 Stiffness of left shoulder, not elsewhere classified: Secondary | ICD-10-CM | POA: Diagnosis not present

## 2024-05-05 DIAGNOSIS — G8929 Other chronic pain: Secondary | ICD-10-CM

## 2024-05-05 DIAGNOSIS — M6281 Muscle weakness (generalized): Secondary | ICD-10-CM | POA: Diagnosis not present

## 2024-05-05 DIAGNOSIS — M25512 Pain in left shoulder: Secondary | ICD-10-CM | POA: Diagnosis not present

## 2024-05-05 DIAGNOSIS — M79642 Pain in left hand: Secondary | ICD-10-CM

## 2024-05-21 ENCOUNTER — Other Ambulatory Visit (HOSPITAL_COMMUNITY): Payer: Self-pay

## 2024-05-27 ENCOUNTER — Other Ambulatory Visit: Payer: Self-pay

## 2024-05-27 ENCOUNTER — Other Ambulatory Visit (HOSPITAL_COMMUNITY): Payer: Self-pay

## 2024-05-27 MED ORDER — MIRTAZAPINE 15 MG PO TABS
15.0000 mg | ORAL_TABLET | Freq: Every day | ORAL | 0 refills | Status: AC
  Filled 2024-05-27 – 2024-06-16 (×2): qty 90, 90d supply, fill #0

## 2024-05-27 MED ORDER — SERTRALINE HCL 50 MG PO TABS
50.0000 mg | ORAL_TABLET | Freq: Every day | ORAL | 0 refills | Status: AC
  Filled 2024-05-27: qty 90, 90d supply, fill #0

## 2024-06-16 ENCOUNTER — Other Ambulatory Visit: Payer: Self-pay

## 2024-06-16 ENCOUNTER — Other Ambulatory Visit (HOSPITAL_COMMUNITY): Payer: Self-pay

## 2024-08-15 ENCOUNTER — Other Ambulatory Visit: Payer: Self-pay

## 2024-08-15 ENCOUNTER — Other Ambulatory Visit (HOSPITAL_COMMUNITY): Payer: Self-pay

## 2024-08-15 MED ORDER — SERTRALINE HCL 50 MG PO TABS
50.0000 mg | ORAL_TABLET | Freq: Every day | ORAL | 0 refills | Status: AC
  Filled 2024-08-15 – 2024-09-12 (×2): qty 90, 90d supply, fill #0

## 2024-08-15 MED ORDER — MIRTAZAPINE 15 MG PO TABS
15.0000 mg | ORAL_TABLET | Freq: Every day | ORAL | 0 refills | Status: AC
  Filled 2024-08-15 – 2024-09-12 (×2): qty 90, 90d supply, fill #0

## 2024-08-19 ENCOUNTER — Other Ambulatory Visit (HOSPITAL_COMMUNITY): Payer: Self-pay

## 2024-08-19 MED ORDER — TAMSULOSIN HCL 0.4 MG PO CAPS
0.4000 mg | ORAL_CAPSULE | Freq: Every day | ORAL | 11 refills | Status: AC
  Filled 2024-08-19: qty 30, 30d supply, fill #0
  Filled 2024-09-12: qty 30, 30d supply, fill #1

## 2024-09-01 ENCOUNTER — Other Ambulatory Visit (HOSPITAL_COMMUNITY): Payer: Self-pay

## 2024-09-12 ENCOUNTER — Other Ambulatory Visit (HOSPITAL_COMMUNITY): Payer: Self-pay
